# Patient Record
Sex: Female | Born: 1935 | Race: White | Hispanic: No | Smoking: Former smoker
Health system: Southern US, Community
[De-identification: ages and names within clinical notes are randomized; demographics above are authoritative.]

## PROBLEM LIST (undated history)

## (undated) DIAGNOSIS — C4491 Basal cell carcinoma of skin, unspecified: Secondary | ICD-10-CM

## (undated) DIAGNOSIS — I351 Nonrheumatic aortic (valve) insufficiency: Secondary | ICD-10-CM

## (undated) DIAGNOSIS — J189 Pneumonia, unspecified organism: Secondary | ICD-10-CM

## (undated) DIAGNOSIS — J45909 Unspecified asthma, uncomplicated: Secondary | ICD-10-CM

## (undated) DIAGNOSIS — I10 Essential (primary) hypertension: Secondary | ICD-10-CM

## (undated) DIAGNOSIS — Z87898 Personal history of other specified conditions: Secondary | ICD-10-CM

## (undated) DIAGNOSIS — I340 Nonrheumatic mitral (valve) insufficiency: Secondary | ICD-10-CM

## (undated) DIAGNOSIS — R0781 Pleurodynia: Secondary | ICD-10-CM

## (undated) HISTORY — DX: Personal history of other specified conditions: Z87.898

## (undated) HISTORY — DX: Pleurodynia: R07.81

---
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
## 2014-02-13 ENCOUNTER — Encounter (INDEPENDENT_AMBULATORY_CARE_PROVIDER_SITE_OTHER): Payer: Self-pay

## 2014-02-13 ENCOUNTER — Ambulatory Visit (INDEPENDENT_AMBULATORY_CARE_PROVIDER_SITE_OTHER): Payer: Medicare Other | Admitting: Cardiovascular Disease

## 2014-02-13 ENCOUNTER — Encounter: Payer: Self-pay | Admitting: *Deleted

## 2014-02-13 ENCOUNTER — Encounter: Payer: Self-pay | Admitting: Cardiovascular Disease

## 2014-02-13 VITALS — BP 123/68 | HR 78 | Ht 65.0 in | Wt 179.0 lb

## 2014-02-13 DIAGNOSIS — R011 Cardiac murmur, unspecified: Secondary | ICD-10-CM | POA: Insufficient documentation

## 2014-02-13 DIAGNOSIS — R0781 Pleurodynia: Secondary | ICD-10-CM | POA: Insufficient documentation

## 2014-02-13 DIAGNOSIS — Z87898 Personal history of other specified conditions: Secondary | ICD-10-CM | POA: Insufficient documentation

## 2014-02-13 DIAGNOSIS — R9431 Abnormal electrocardiogram [ECG] [EKG]: Secondary | ICD-10-CM

## 2014-02-13 NOTE — Assessment & Plan Note (Signed)
For age LA signal not that remarkable  With murmur will check echo for chamber sizes and LV mass  No chest pain or need for  Ischemic w/u

## 2014-02-13 NOTE — Progress Notes (Signed)
Patient ID: Krista Parker, female   DOB: 01-Jul-1932, 80 y.o.   MRN: [id number]   80 yo referred by Dr Ramon Dredge for abnormal ECG and murmur.  She has generally been healthy.  She is widowed 2 years ago and originally from [place].  She has multiple children and grandchildren in [place].  She had rib pain earlier this Spring and has some fatigue.  She had some allergies.  No fever no chest pain Mild exertional dyspnea History of scarlet fever.  History of murmur but "not serious"  ECG obtained by primary 5/4 reviewed and flat lateral ST segments borderline LAE.  No criteria for LVH.  She walks regularly.  No palpitations, syncope or history of PAF.  BP occasionally high but fluctuates.  On no cardiac meds.  Rib pain has improved and actually sounds more like crampy abdominal pain Or IBS.     ROS: Denies fever, malais, weight loss, blurry vision, decreased visual acuity, cough, sputum, SOB, hemoptysis, pleuritic pain, palpitaitons, heartburn, abdominal pain, melena, lower extremity edema, claudication, or rash.  All other systems reviewed and negative   General: Affect appropriate Healthy:  appears stated age 80: normal Neck supple with no adenopathy JVP normal no bruits no thyromegaly Lungs clear with no wheezing and good diaphragmatic motion Heart:  S1/S2 1/6 SEM  murmur,rub, gallop or click PMI normal Abdomen: benighn, BS positve, no tenderness, no AAA no bruit.  No HSM or HJR Distal pulses intact with no bruits No edema Neuro non-focal Skin warm and dry No muscular weakness  Medications Current Outpatient Prescriptions  Medication Sig Dispense Refill  . CALCIUM-VITAMIN D PO Chew 2 daily      . DiphenhydrAMINE HCl (BENADRYL PO) Take by mouth as needed.      Krista Parker Sodium (COLACE PO) Take by mouth as needed.      . Naproxen Sodium (ALEVE PO) Take by mouth as needed.       No current facility-administered medications for this visit.    Allergies Review of patient's  allergies indicates no known allergies.  Family History: No family history on file.  Social History: History   Social History  . Marital Status: Widowed    Spouse Name: N/A    Number of Children: N/A  . Years of Education: N/A   Occupational History  . Not on file.   Social History Main Topics  . Smoking status: Former Research scientist (life sciences)  . Smokeless tobacco: Not on file  . Alcohol Use: Yes  . Drug Use: No  . Sexual Activity: Not on file   Other Topics Concern  . Not on file   Social History Narrative  . No narrative on file    Electrocardiogram:  5/4  SR LAE flat lateral ST segments   Assessment and Plan

## 2014-02-13 NOTE — Patient Instructions (Signed)

## 2014-02-13 NOTE — Assessment & Plan Note (Signed)
AV sclerosis murmur f/u echo in light of abnormal eCG

## 2014-03-05 ENCOUNTER — Ambulatory Visit (HOSPITAL_COMMUNITY): Payer: Medicare Other | Attending: Cardiology | Admitting: Radiology

## 2014-03-05 ENCOUNTER — Other Ambulatory Visit: Payer: Self-pay

## 2014-03-05 DIAGNOSIS — Z87891 Personal history of nicotine dependence: Secondary | ICD-10-CM | POA: Insufficient documentation

## 2014-03-05 DIAGNOSIS — R9431 Abnormal electrocardiogram [ECG] [EKG]: Secondary | ICD-10-CM | POA: Insufficient documentation

## 2014-03-05 DIAGNOSIS — I359 Nonrheumatic aortic valve disorder, unspecified: Secondary | ICD-10-CM | POA: Insufficient documentation

## 2014-03-05 DIAGNOSIS — R011 Cardiac murmur, unspecified: Secondary | ICD-10-CM | POA: Insufficient documentation

## 2014-03-05 NOTE — Progress Notes (Signed)
Echocardiogram performed.  

## 2014-03-08 ENCOUNTER — Telehealth: Payer: Self-pay | Admitting: Cardiovascular Disease

## 2014-03-08 NOTE — Telephone Encounter (Signed)
New message ° ° ° ° ° °Want echo results °

## 2014-03-08 NOTE — Telephone Encounter (Signed)
PT AWARE OF ECHO RESULTS./CY 

## 2015-05-27 ENCOUNTER — Other Ambulatory Visit: Payer: Self-pay | Admitting: Gastroenterology

## 2015-05-27 DIAGNOSIS — R1011 Right upper quadrant pain: Secondary | ICD-10-CM

## 2015-06-02 ENCOUNTER — Ambulatory Visit
Admission: RE | Admit: 2015-06-02 | Discharge: 2015-06-02 | Disposition: A | Discharge status: Home / Self Care | Payer: Medicare Other | Source: Ambulatory Visit | Attending: Gastroenterology | Admitting: Gastroenterology

## 2015-06-02 DIAGNOSIS — R1011 Right upper quadrant pain: Secondary | ICD-10-CM

## 2015-06-18 ENCOUNTER — Emergency Department (HOSPITAL_COMMUNITY)
Admission: EM | Admit: 2015-06-18 | Discharge: 2015-06-18 | Disposition: A | Discharge status: Home / Self Care | Payer: Medicare Other | Attending: Emergency Medicine | Admitting: Emergency Medicine

## 2015-06-18 ENCOUNTER — Emergency Department (HOSPITAL_COMMUNITY): Payer: Medicare Other

## 2015-06-18 ENCOUNTER — Encounter (HOSPITAL_COMMUNITY): Payer: Self-pay | Admitting: *Deleted

## 2015-06-18 DIAGNOSIS — S22078A Other fracture of T9-T10 vertebra, initial encounter for closed fracture: Secondary | ICD-10-CM | POA: Diagnosis not present

## 2015-06-18 DIAGNOSIS — Y999 Unspecified external cause status: Secondary | ICD-10-CM | POA: Diagnosis not present

## 2015-06-18 DIAGNOSIS — X58XXXA Exposure to other specified factors, initial encounter: Secondary | ICD-10-CM | POA: Diagnosis not present

## 2015-06-18 DIAGNOSIS — S22068A Other fracture of T7-T8 thoracic vertebra, initial encounter for closed fracture: Secondary | ICD-10-CM | POA: Insufficient documentation

## 2015-06-18 DIAGNOSIS — Z79899 Other long term (current) drug therapy: Secondary | ICD-10-CM | POA: Diagnosis not present

## 2015-06-18 DIAGNOSIS — Y929 Unspecified place or not applicable: Secondary | ICD-10-CM | POA: Diagnosis not present

## 2015-06-18 DIAGNOSIS — Y939 Activity, unspecified: Secondary | ICD-10-CM | POA: Diagnosis not present

## 2015-06-18 DIAGNOSIS — S22009A Unspecified fracture of unspecified thoracic vertebra, initial encounter for closed fracture: Secondary | ICD-10-CM

## 2015-06-18 DIAGNOSIS — S32048A Other fracture of fourth lumbar vertebra, initial encounter for closed fracture: Secondary | ICD-10-CM | POA: Insufficient documentation

## 2015-06-18 DIAGNOSIS — Z87891 Personal history of nicotine dependence: Secondary | ICD-10-CM | POA: Insufficient documentation

## 2015-06-18 DIAGNOSIS — S299XXA Unspecified injury of thorax, initial encounter: Secondary | ICD-10-CM | POA: Diagnosis present

## 2015-06-18 LAB — BASIC METABOLIC PANEL
ANION GAP: 11 (ref 5–15)
BUN: 18 mg/dL (ref 6–20)
CALCIUM: 9.5 mg/dL (ref 8.9–10.3)
CO2: 22 mmol/L (ref 22–32)
CREATININE: 0.83 mg/dL (ref 0.44–1.00)
Chloride: 101 mmol/L (ref 101–111)
Glucose, Bld: 102 mg/dL — ABNORMAL HIGH (ref 65–99)
Potassium: 4.1 mmol/L (ref 3.5–5.1)
Sodium: 134 mmol/L — ABNORMAL LOW (ref 135–145)

## 2015-06-18 LAB — CBC
HCT: 38.4 % (ref 36.0–46.0)
Hemoglobin: 13.2 g/dL (ref 12.0–15.0)
MCH: 30.3 pg (ref 26.0–34.0)
MCHC: 34.4 g/dL (ref 30.0–36.0)
MCV: 88.3 fL (ref 78.0–100.0)
PLATELETS: 255 10*3/uL (ref 150–400)
RBC: 4.35 MIL/uL (ref 3.87–5.11)
RDW: 12.6 % (ref 11.5–15.5)
WBC: 7.5 10*3/uL (ref 4.0–10.5)

## 2015-06-18 LAB — I-STAT TROPONIN, ED: TROPONIN I, POC: 0.01 ng/mL (ref 0.00–0.08)

## 2015-06-18 MED ORDER — HYDROCODONE-ACETAMINOPHEN 5-325 MG PO TABS: 1.0000 | ORAL_TABLET | Freq: Four times a day (QID) | ORAL | Status: DC | PRN

## 2015-06-18 MED ORDER — HYDROCODONE-ACETAMINOPHEN 5-325 MG PO TABS
1.0000 | ORAL_TABLET | Freq: Once | ORAL | Status: AC
Start: 2015-06-18 — End: 2015-06-18
  Administered 2015-06-18: 1 via ORAL
  Filled 2015-06-18: qty 1

## 2015-06-18 NOTE — ED Notes (Signed)
Pt reports having back pain with movement in back and hurts to take a deep breath.

## 2015-06-18 NOTE — ED Notes (Signed)
Pt transported to Xray. 

## 2015-06-18 NOTE — ED Provider Notes (Signed)
CSN: [id number]     Arrival date & time 06/18/15  1221 History   First MD Initiated Contact with Patient 06/18/15 1354     Chief Complaint  Patient presents with  . Back Pain     (Consider location/radiation/quality/duration/timing/severity/associated sxs/prior Treatment) HPI Comments: 81 year old healthy female who presents with back pain. Patient states that 2 weeks ago she began having pain in her right arm. This pain later improved but then she began having generalized body pain and difficulty getting out of bed. For the past few days she has had severe thoracic back pain which is worse with any movement of her arms and worse when she takes a deep breath. The pain feels like it is in her back along her bra line and wraps around to the front at the bottom of her ribs. She denies any anterior chest pain, fevers, cough/cold symptoms, or shortness of breath. No vomiting, diarrhea, or abdominal pain. No trauma or recent falls. She has been taking Tylenol and Aleve but the pain remains severe.  Patient is a 81 y.o. female presenting with back pain. The history is provided by the patient.  Back Pain   Past Medical History  Diagnosis Date  . Rib pain   . History of abdominal pain    No past surgical history on file. No family history on file. Social History  Substance Use Topics  . Smoking status: Former Research scientist (life sciences)  . Smokeless tobacco: None  . Alcohol Use: Yes   OB History    No data available     Review of Systems  Musculoskeletal: Positive for back pain.   10 Systems reviewed and are negative for acute change except as noted in the HPI.    Allergies  Review of patient's allergies indicates no known allergies.  Home Medications   Prior to Admission medications   Medication Sig Start Date End Date Taking? Authorizing Provider  acetaminophen (TYLENOL) 650 MG CR tablet Take 650 mg by mouth every 8 (eight) hours as needed for pain.   Yes Historical Provider, MD  Calcium-Vitamin  D 500-125 MG-UNIT TABS Take 3 each by mouth daily.   Yes Historical Provider, MD  CALCIUM-VITAMIN D PO Take 3 each by mouth daily. Chew 2 daily   Yes Historical Provider, MD  diphenhydrAMINE (BENADRYL) 25 mg capsule Take 25 mg by mouth at bedtime as needed.   Yes Historical Provider, MD  Naproxen Sodium 220 MG CAPS Take 2 tablets by mouth daily as needed. For pain   Yes Historical Provider, MD  omeprazole (PRILOSEC) 20 MG capsule Take 20 mg by mouth daily. 06/07/15  Yes Historical Provider, MD  polyethylene glycol powder (GLYCOLAX/MIRALAX) powder Take 17 g by mouth daily as needed.   Yes Historical Provider, MD  DiphenhydrAMINE HCl (BENADRYL PO) Take by mouth as needed.    Historical Provider, MD  Docusate Sodium (COLACE PO) Take by mouth as needed.    Historical Provider, MD  Naproxen Sodium (ALEVE PO) Take by mouth as needed.    Historical Provider, MD   BP 169/69 mmHg  Pulse 74  Temp(Src) 98.1 F (36.7 C) (Oral)  Resp 20  Ht 5\' 5"  (1.651 m)  Wt 173 lb (78.472 kg)  BMI 28.79 kg/m2  SpO2 87% Physical Exam  Constitutional: She is oriented to person, place, and time. She appears well-developed and well-nourished. No distress.  HENT:  Head: Normocephalic and atraumatic.  Moist mucous membranes  Eyes: Conjunctivae are normal. Pupils are equal, round, and reactive to light.  Neck: Neck supple.  Cardiovascular: Normal rate, regular rhythm and normal heart sounds.   No murmur heard. Pulmonary/Chest: Effort normal and breath sounds normal.  Abdominal: Soft. Bowel sounds are normal. She exhibits no distension. There is no tenderness.  Musculoskeletal: She exhibits no edema.  No midline spinal tenderness or stepoff  Neurological: She is alert and oriented to person, place, and time. She exhibits normal muscle tone.  Fluent speech, 5/5 strength and normal sensation x all 4 extremities  Skin: Skin is warm and dry. No rash noted.  Psychiatric: She has a normal mood and affect. Judgment normal.   Nursing note and vitals reviewed.   ED Course  Procedures (including critical care time) Labs Review Labs Reviewed  BASIC METABOLIC PANEL - Abnormal; Notable for the following:    Sodium 134 (*)    Glucose, Bld 102 (*)    All other components within normal limits  CBC  I-STAT TROPOININ, ED    Imaging Review Dg Chest 2 View  06/18/2015   CLINICAL DATA:  Lower rib pain for 2 weeks.  No known injury.  EXAM: CHEST  2 VIEW  COMPARISON:  None.  FINDINGS: The cardiac silhouette, mediastinal and hilar contours are within normal limits. Chronic appearing bronchitic type lung changes and streaky basilar atelectasis. No definite infiltrates or effusions. The bony thorax is intact. There are multiple thoracic compression fractures.  IMPRESSION: No acute cardiopulmonary findings. Chronic appearing bronchitic lung changes and basilar scarring.  Thoracic compression fractures are noted of uncertain age.   Electronically Signed   By: Marijo Sanes M.D.   On: 06/18/2015 14:36   Ct Thoracic Spine Wo Contrast  06/18/2015   CLINICAL DATA:  Severe back pain. Evaluate compression fractures seen on x-rays.  EXAM: CT LUMBAR SPINE WITHOUT CONTRAST  TECHNIQUE: Multidetector CT imaging of the lumbar spine was performed without intravenous contrast administration. Multiplanar CT image reconstructions were also generated.  COMPARISON:  Chest x-ray 06/18/2015  FINDINGS: There are compression fractures of T7, T9 and L4. The T7 fracture appears to be acute with paraspinal hematoma. No retropulsion or canal compromise. The T9 fracture is likely chronic. No retropulsion or canal compromise. The the L4 fracture is acute. Minimal retropulsion of the superior posterior aspect of the L4 vertebral body but no significant canal compromise.  Advanced facet disease in the lower lumbar spine but no pars defects. No facet or laminar fractures.  Moderate multifactorial spinal and bilateral lateral recess stenosis at L4-5 due to a bulging  uncovered disc, short pedicles and facet disease.  The lungs demonstrate emphysematous changes but no acute pulmonary findings. No worrisome pulmonary lesions. Two small right lower lobe pulmonary nodules are noted measuring 4.5 and 5 mm respectively these are best seen on series 2010 image 75 and 78.  No definite rib fractures. The visualized bony pelvis is intact. The SI joints are intact. Moderate vascular calcifications.  IMPRESSION: 1. Acute T7 and L4 compression fractures as discussed above. The T9 fracture appears remote. 2. Advanced facet disease in the lower lumbar spine. 3. 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 4. Two right lower lobe pulmonary nodules. If the patient is at high risk for bronchogenic carcinoma, follow-up chest CT at 6-12 months is recommended. If the patient is at low risk for bronchogenic carcinoma, follow-up chest CT at 12 months is recommended. This recommendation follows the consensus statement: Guidelines for Management of Small Pulmonary Nodules Detected on CT Scans: A Statement from the [employer] as published  in Radiology 2005;237:395-400. 5. Vascular calcifications without definite aneurysm.   Electronically Signed   By: Marijo Sanes M.D.   On: 06/18/2015 16:14   Ct Lumbar Spine Wo Contrast  06/18/2015   CLINICAL DATA:  Severe back pain. Evaluate compression fractures seen on x-rays.  EXAM: CT LUMBAR SPINE WITHOUT CONTRAST  TECHNIQUE: Multidetector CT imaging of the lumbar spine was performed without intravenous contrast administration. Multiplanar CT image reconstructions were also generated.  COMPARISON:  Chest x-ray 06/18/2015  FINDINGS: There are compression fractures of T7, T9 and L4. The T7 fracture appears to be acute with paraspinal hematoma. No retropulsion or canal compromise. The T9 fracture is likely chronic. No retropulsion or canal compromise. The the L4 fracture is acute. Minimal retropulsion of the superior  posterior aspect of the L4 vertebral body but no significant canal compromise.  Advanced facet disease in the lower lumbar spine but no pars defects. No facet or laminar fractures.  Moderate multifactorial spinal and bilateral lateral recess stenosis at L4-5 due to a bulging uncovered disc, short pedicles and facet disease.  The lungs demonstrate emphysematous changes but no acute pulmonary findings. No worrisome pulmonary lesions. Two small right lower lobe pulmonary nodules are noted measuring 4.5 and 5 mm respectively these are best seen on series 2010 image 75 and 78.  No definite rib fractures. The visualized bony pelvis is intact. The SI joints are intact. Moderate vascular calcifications.  IMPRESSION: 1. Acute T7 and L4 compression fractures as discussed above. The T9 fracture appears remote. 2. Advanced facet disease in the lower lumbar spine. 3. 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 4. Two right lower lobe pulmonary nodules. If the patient is at high risk for bronchogenic carcinoma, follow-up chest CT at 6-12 months is recommended. If the patient is at low risk for bronchogenic carcinoma, follow-up chest CT at 12 months is recommended. This recommendation follows the consensus statement: Guidelines for Management of Small Pulmonary Nodules Detected on CT Scans: A Statement from the [employer] as published in Radiology 2005;237:395-400. 5. Vascular calcifications without definite aneurysm.   Electronically Signed   By: Marijo Sanes M.D.   On: 06/18/2015 16:14   I have personally reviewed and evaluated these images and lab results as part of my medical decision-making.   EKG Interpretation   Date/Time:  Wednesday June 18 2015 12:35:49 EDT Ventricular Rate:  88 PR Interval:  154 QRS Duration: 66 QT Interval:  362 QTC Calculation: 438 R Axis:   58 Text Interpretation:  Normal sinus rhythm Possible Left atrial enlargement  Low voltage QRS T  wave abnormality, consider anterolateral ischemia  Abnormal ECG ST elevation in aVR with depression in II,  T wave inversion  in III,  concerning for posterior ischemia Confirmed by LITTLE MD, RACHEL  ([id number]) on 06/18/2015 12:51:38 PM     Medications  HYDROcodone-acetaminophen (NORCO/VICODIN) 5-325 MG per tablet 1 tablet (1 tablet Oral Given 06/18/15 1512)    MDM   Final diagnoses:  None   81 year old female who presents with several days of worsening back pain radiating to her sides below her ribs which is worse w/ arm movement. Patient uncomfortable but well appearing at presentation. Vital signs unremarkable. EKG showed questionable ST depression in lead II, no previous EKG for comparison. Patient denies anterior chest pain and denying any sudden ripping or tearing pain. No neurologic deficits on exam. Obtained above labs as well as chest x-ray.  Chest x-ray showed several thoracic spine compression fractures. Obtain  CT of T and L-spine for better characterization.CT shows chronic T9 fracture and acute fractures at T7 and L4. No canal compromise at any of the fracture sites. I spoke with neurosurgery regarding the patient's fractures and T7 paraspinal hematoma; I appreciate Dr. Donnella Bi assistance. He has recommended bedrest and no bending or lifting. I have relayed this information to the patient. He will see the patient in 1-2 weeks in the clinic.  Patient denies any anesthesia, extremity weakness, or bowel/bladder incontinence and I feel that she is safe for discharge home with pain control. I have extensively reviewed return precautions and they have voiced understanding. Patient remains well appearing at time of discharge and states that her pain is improved after receiving Norco. I have provided her with a prescription for pain control at home and discussed precautions regarding narcotic use. All questions answered. Patient discharged in satisfactory condition.  Sharlett Iles,  MD 06/18/15 [phone]

## 2015-07-04 ENCOUNTER — Other Ambulatory Visit: Payer: Self-pay | Admitting: Family Medicine

## 2015-07-04 DIAGNOSIS — M810 Age-related osteoporosis without current pathological fracture: Secondary | ICD-10-CM

## 2015-07-26 ENCOUNTER — Other Ambulatory Visit: Payer: Self-pay | Admitting: Family Medicine

## 2015-07-26 DIAGNOSIS — E2839 Other primary ovarian failure: Secondary | ICD-10-CM

## 2015-07-29 ENCOUNTER — Other Ambulatory Visit: Payer: Medicare Other

## 2016-06-10 ENCOUNTER — Other Ambulatory Visit: Payer: Medicare Other

## 2016-06-10 ENCOUNTER — Other Ambulatory Visit: Payer: Self-pay | Admitting: Nurse Practitioner

## 2016-06-10 DIAGNOSIS — R918 Other nonspecific abnormal finding of lung field: Secondary | ICD-10-CM

## 2016-06-11 ENCOUNTER — Ambulatory Visit
Admission: RE | Admit: 2016-06-11 | Discharge: 2016-06-11 | Disposition: A | Discharge status: Home / Self Care | Payer: Medicare Other | Source: Ambulatory Visit | Attending: Nurse Practitioner | Admitting: Nurse Practitioner

## 2016-06-11 DIAGNOSIS — R918 Other nonspecific abnormal finding of lung field: Secondary | ICD-10-CM

## 2016-07-15 DIAGNOSIS — C4491 Basal cell carcinoma of skin, unspecified: Secondary | ICD-10-CM

## 2016-07-15 HISTORY — DX: Basal cell carcinoma of skin, unspecified: C44.91

## 2019-03-26 ENCOUNTER — Encounter: Payer: Self-pay | Admitting: *Deleted

## 2019-07-31 ENCOUNTER — Other Ambulatory Visit: Payer: Self-pay | Admitting: Gastroenterology

## 2019-07-31 ENCOUNTER — Telehealth: Payer: Self-pay | Admitting: *Deleted

## 2019-07-31 DIAGNOSIS — R1310 Dysphagia, unspecified: Secondary | ICD-10-CM

## 2019-07-31 DIAGNOSIS — R1319 Other dysphagia: Secondary | ICD-10-CM

## 2019-08-07 ENCOUNTER — Ambulatory Visit
Admission: RE | Admit: 2019-08-07 | Discharge: 2019-08-07 | Disposition: A | Discharge status: Home / Self Care | Payer: Medicare Other | Source: Ambulatory Visit | Attending: Gastroenterology | Admitting: Gastroenterology

## 2019-08-07 ENCOUNTER — Other Ambulatory Visit: Payer: Self-pay

## 2019-08-07 DIAGNOSIS — R1310 Dysphagia, unspecified: Secondary | ICD-10-CM | POA: Diagnosis present

## 2019-08-07 DIAGNOSIS — R1319 Other dysphagia: Secondary | ICD-10-CM

## 2019-12-11 ENCOUNTER — Emergency Department: Payer: Medicare Other

## 2019-12-11 ENCOUNTER — Inpatient Hospital Stay
Admission: EM | Admit: 2019-12-11 | Discharge: 2019-12-18 | DRG: 193 | Disposition: A | Discharge status: Home Health | Payer: Medicare Other | Attending: Internal Medicine | Admitting: Internal Medicine

## 2019-12-11 ENCOUNTER — Other Ambulatory Visit: Payer: Self-pay

## 2019-12-11 ENCOUNTER — Encounter: Payer: Self-pay | Admitting: *Deleted

## 2019-12-11 DIAGNOSIS — I10 Essential (primary) hypertension: Secondary | ICD-10-CM | POA: Insufficient documentation

## 2019-12-11 DIAGNOSIS — E871 Hypo-osmolality and hyponatremia: Secondary | ICD-10-CM | POA: Diagnosis present

## 2019-12-11 DIAGNOSIS — J440 Chronic obstructive pulmonary disease with acute lower respiratory infection: Secondary | ICD-10-CM | POA: Diagnosis present

## 2019-12-11 DIAGNOSIS — J441 Chronic obstructive pulmonary disease with (acute) exacerbation: Secondary | ICD-10-CM | POA: Diagnosis present

## 2019-12-11 DIAGNOSIS — J9601 Acute respiratory failure with hypoxia: Secondary | ICD-10-CM | POA: Diagnosis present

## 2019-12-11 DIAGNOSIS — I160 Hypertensive urgency: Secondary | ICD-10-CM | POA: Diagnosis present

## 2019-12-11 DIAGNOSIS — Z85828 Personal history of other malignant neoplasm of skin: Secondary | ICD-10-CM | POA: Diagnosis not present

## 2019-12-11 DIAGNOSIS — Z87891 Personal history of nicotine dependence: Secondary | ICD-10-CM

## 2019-12-11 DIAGNOSIS — Z66 Do not resuscitate: Secondary | ICD-10-CM | POA: Diagnosis present

## 2019-12-11 DIAGNOSIS — E669 Obesity, unspecified: Secondary | ICD-10-CM | POA: Diagnosis present

## 2019-12-11 DIAGNOSIS — I351 Nonrheumatic aortic (valve) insufficiency: Secondary | ICD-10-CM | POA: Diagnosis present

## 2019-12-11 DIAGNOSIS — I080 Rheumatic disorders of both mitral and aortic valves: Secondary | ICD-10-CM | POA: Diagnosis present

## 2019-12-11 DIAGNOSIS — J189 Pneumonia, unspecified organism: Principal | ICD-10-CM

## 2019-12-11 DIAGNOSIS — N898 Other specified noninflammatory disorders of vagina: Secondary | ICD-10-CM | POA: Diagnosis present

## 2019-12-11 DIAGNOSIS — Z20822 Contact with and (suspected) exposure to covid-19: Secondary | ICD-10-CM | POA: Diagnosis present

## 2019-12-11 DIAGNOSIS — Z6832 Body mass index (BMI) 32.0-32.9, adult: Secondary | ICD-10-CM | POA: Diagnosis not present

## 2019-12-11 DIAGNOSIS — I340 Nonrheumatic mitral (valve) insufficiency: Secondary | ICD-10-CM | POA: Diagnosis present

## 2019-12-11 DIAGNOSIS — R0902 Hypoxemia: Secondary | ICD-10-CM | POA: Insufficient documentation

## 2019-12-11 DIAGNOSIS — R52 Pain, unspecified: Secondary | ICD-10-CM

## 2019-12-11 HISTORY — DX: Nonrheumatic mitral (valve) insufficiency: I34.0

## 2019-12-11 HISTORY — DX: Unspecified asthma, uncomplicated: J45.909

## 2019-12-11 HISTORY — DX: Essential (primary) hypertension: I10

## 2019-12-11 HISTORY — DX: Nonrheumatic aortic (valve) insufficiency: I35.1

## 2019-12-11 HISTORY — DX: Pneumonia, unspecified organism: J18.9

## 2019-12-11 LAB — CBC WITH DIFFERENTIAL/PLATELET
Abs Immature Granulocytes: 0.19 10*3/uL — ABNORMAL HIGH (ref 0.00–0.07)
Basophils Absolute: 0.1 10*3/uL (ref 0.0–0.1)
Basophils Relative: 0 %
Eosinophils Absolute: 0.1 10*3/uL (ref 0.0–0.5)
Eosinophils Relative: 1 %
HCT: 41.3 % (ref 36.0–46.0)
Hemoglobin: 14.2 g/dL (ref 12.0–15.0)
Immature Granulocytes: 1 %
Lymphocytes Relative: 6 %
Lymphs Abs: 1 10*3/uL (ref 0.7–4.0)
MCH: 29 pg (ref 26.0–34.0)
MCHC: 34.4 g/dL (ref 30.0–36.0)
MCV: 84.5 fL (ref 80.0–100.0)
Monocytes Absolute: 0.9 10*3/uL (ref 0.1–1.0)
Monocytes Relative: 5 %
Neutro Abs: 14 10*3/uL — ABNORMAL HIGH (ref 1.7–7.7)
Neutrophils Relative %: 87 %
Platelets: 499 10*3/uL — ABNORMAL HIGH (ref 150–400)
RBC: 4.89 MIL/uL (ref 3.87–5.11)
RDW: 12.5 % (ref 11.5–15.5)
WBC: 16.2 10*3/uL — ABNORMAL HIGH (ref 4.0–10.5)
nRBC: 0 % (ref 0.0–0.2)

## 2019-12-11 LAB — COMPREHENSIVE METABOLIC PANEL
ALT: 23 U/L (ref 0–44)
AST: 28 U/L (ref 15–41)
Albumin: 3.6 g/dL (ref 3.5–5.0)
Alkaline Phosphatase: 86 U/L (ref 38–126)
Anion gap: 12 (ref 5–15)
BUN: 19 mg/dL (ref 8–23)
CO2: 25 mmol/L (ref 22–32)
Calcium: 9.7 mg/dL (ref 8.9–10.3)
Chloride: 91 mmol/L — ABNORMAL LOW (ref 98–111)
Creatinine, Ser: 0.74 mg/dL (ref 0.44–1.00)
GFR calc Af Amer: >60 mL/min (ref >60)
GFR calc non Af Amer: >60 mL/min (ref >60)
Glucose, Bld: 110 mg/dL — ABNORMAL HIGH (ref 70–99)
Potassium: 3.7 mmol/L (ref 3.5–5.1)
Sodium: 128 mmol/L — ABNORMAL LOW (ref 135–145)
Total Bilirubin: 0.7 mg/dL (ref 0.3–1.2)
Total Protein: 7.4 g/dL (ref 6.5–8.1)

## 2019-12-11 LAB — TROPONIN I (HIGH SENSITIVITY): Troponin I (High Sensitivity): 6 ng/L (ref <18)

## 2019-12-11 LAB — BRAIN NATRIURETIC PEPTIDE: B Natriuretic Peptide: 278 pg/mL — ABNORMAL HIGH (ref 0.0–100.0)

## 2019-12-11 LAB — LACTIC ACID, PLASMA
Lactic Acid, Venous: 1.7 mmol/L (ref 0.5–1.9)
Lactic Acid, Venous: 1.1 mmol/L (ref 0.5–1.9)

## 2019-12-11 LAB — PROCALCITONIN: Procalcitonin: <0.1 ng/mL

## 2019-12-11 MED ORDER — SODIUM CHLORIDE 0.9 % IV SOLN
2.0000 g | Freq: Once | INTRAVENOUS | Status: AC
  Administered 2019-12-11: 2 g via INTRAVENOUS
  Filled 2019-12-11: qty 20

## 2019-12-11 MED ORDER — SODIUM CHLORIDE 0.9 % IV SOLN: INTRAVENOUS | Status: DC

## 2019-12-11 MED ORDER — IOHEXOL 350 MG/ML SOLN
75.0000 mL | Freq: Once | INTRAVENOUS | Status: AC | PRN
  Administered 2019-12-11: 75 mL via INTRAVENOUS

## 2019-12-11 MED ORDER — SODIUM CHLORIDE 0.9 % IV SOLN
500.0000 mg | INTRAVENOUS | Status: AC
  Administered 2019-12-12 – 2019-12-15 (×4): 500 mg via INTRAVENOUS
  Filled 2019-12-11 (×4): qty 500

## 2019-12-11 MED ORDER — VANCOMYCIN HCL IN DEXTROSE 1-5 GM/200ML-% IV SOLN: 1000.0000 mg | Freq: Once | INTRAVENOUS | Status: DC

## 2019-12-11 MED ORDER — SODIUM CHLORIDE 0.9 % IV SOLN
2.0000 g | INTRAVENOUS | Status: AC
  Administered 2019-12-12 – 2019-12-16 (×4): 2 g via INTRAVENOUS
  Filled 2019-12-11 (×2): qty 2
  Filled 2019-12-11: qty 20
  Filled 2019-12-11 (×2): qty 2

## 2019-12-11 MED ORDER — METHYLPREDNISOLONE SODIUM SUCC 40 MG IJ SOLR
40.0000 mg | Freq: Four times a day (QID) | INTRAMUSCULAR | Status: AC
  Administered 2019-12-12 (×4): 40 mg via INTRAVENOUS
  Filled 2019-12-11 (×4): qty 1

## 2019-12-11 MED ORDER — IPRATROPIUM-ALBUTEROL 0.5-2.5 (3) MG/3ML IN SOLN
3.0000 mL | Freq: Once | RESPIRATORY_TRACT | Status: AC
  Administered 2019-12-11: 3 mL via RESPIRATORY_TRACT
  Filled 2019-12-11: qty 3

## 2019-12-11 MED ORDER — LABETALOL HCL 5 MG/ML IV SOLN: 10.0000 mg | INTRAVENOUS | Status: DC | PRN

## 2019-12-11 MED ORDER — SODIUM CHLORIDE 0.9 % IV BOLUS
1000.0000 mL | Freq: Once | INTRAVENOUS | Status: AC
  Administered 2019-12-11: 1000 mL via INTRAVENOUS

## 2019-12-11 MED ORDER — IPRATROPIUM-ALBUTEROL 0.5-2.5 (3) MG/3ML IN SOLN
3.0000 mL | Freq: Four times a day (QID) | RESPIRATORY_TRACT | Status: DC
  Administered 2019-12-12 – 2019-12-13 (×6): 3 mL via RESPIRATORY_TRACT
  Filled 2019-12-11 (×6): qty 3

## 2019-12-11 MED ORDER — IPRATROPIUM-ALBUTEROL 0.5-2.5 (3) MG/3ML IN SOLN
3.0000 mL | Freq: Once | RESPIRATORY_TRACT | Status: AC
  Administered 2019-12-11: 21:00:00 3 mL via RESPIRATORY_TRACT
  Filled 2019-12-11: qty 3

## 2019-12-11 MED ORDER — ALBUTEROL SULFATE (2.5 MG/3ML) 0.083% IN NEBU: 2.5000 mg | INHALATION_SOLUTION | RESPIRATORY_TRACT | Status: DC | PRN

## 2019-12-11 MED ORDER — ENOXAPARIN SODIUM 40 MG/0.4ML ~~LOC~~ SOLN
40.0000 mg | SUBCUTANEOUS | Status: DC
  Administered 2019-12-12 – 2019-12-17 (×7): 40 mg via SUBCUTANEOUS
  Filled 2019-12-11 (×7): qty 0.4

## 2019-12-11 MED ORDER — METHYLPREDNISOLONE SODIUM SUCC 125 MG IJ SOLR
125.0000 mg | Freq: Once | INTRAMUSCULAR | Status: AC
  Administered 2019-12-11: 125 mg via INTRAVENOUS
  Filled 2019-12-11: qty 2

## 2019-12-11 MED ORDER — SODIUM CHLORIDE 0.9 % IV SOLN
500.0000 mg | Freq: Once | INTRAVENOUS | Status: AC
  Administered 2019-12-12: 500 mg via INTRAVENOUS
  Filled 2019-12-11: qty 500

## 2019-12-11 MED ORDER — VANCOMYCIN HCL 1500 MG/300ML IV SOLN
1500.0000 mg | Freq: Once | INTRAVENOUS | Status: DC
  Filled 2019-12-11: qty 300

## 2019-12-11 MED ORDER — PREDNISONE 20 MG PO TABS
40.0000 mg | ORAL_TABLET | Freq: Every day | ORAL | Status: AC
  Administered 2019-12-13 – 2019-12-16 (×4): 40 mg via ORAL
  Filled 2019-12-11 (×4): qty 2

## 2019-12-11 NOTE — ED Triage Notes (Addendum)
Per EMS report, patient c/o increased shortness of breath and weakness today after being diagnosed with pneumonia one week ago. Patient report increased dyspnea with movement. Patient was 91% on Room air at home and placed on 3L O2 via [place] by EMT and was 98% per their report. Patient arrives alert and oriented x4.  Patient lives in independent living at [facility]. Patient had two doses of Moderna with the second dose on 11/21/19. Patient tested negative for Covid and the flu on 12/07/2019.

## 2019-12-11 NOTE — ED Notes (Signed)
Patient voided over the bed pan. Purewick placed after bedpan was removed. Brief is in place. Linens changed and chux in place. Patient taken to CT scan.

## 2019-12-11 NOTE — H&P (Signed)
History and Physical    Krista Parker [id number] DOB: September 29, 1931 DOA: 12/11/2019  PCP: Leonel Ramsay, MD   Patient coming from: home I have personally briefly reviewed patient's old medical records in [place]  Chief Complaint: short ess of breath and recent pneumonia  HPI: Krista Parker is a 86 y.o. female with medical history significant for hypertension the, diagnosed with pneumonia 1 week prior initially failing treatment with Augmentin and subsequently with Levaquin who presented to the emergency room for continued shortness of breath in spite of outpatient treatment.  Received both Covid shots, last one on 12/02/2019 and tested negative for Covid on 12/07/2019.  She denied chest pain or chills  ED Course: On arrival she was afebrile, O2 sat was 91% on room air at rest and she was placed on 3 L with improvement to 98%.  Blood pressure was 183/102 and she was tachycardic to 100s.  RR 32.  White cell count was 16,000, BNP 278, troponin negative at six.  Sodium 128.  Lactic acid 1.7, procalcitonin less than 0.1.  Chest x-ray showed multifocal pneumonia with airspace opacity throughout much of the right lung and left upper lobe.  A chest negative for PE.  Patient was given multiple rounds of DuoNeb's as well as Solu-Medrol in the emergency room.  Due to continued increased work of breathing, hospitalist consulted for admission  Review of Systems: As per HPI otherwise 10 point review of systems negative.    Past Medical History:  Diagnosis Date  . Asthma   . History of abdominal pain   . Hypertension   . Pneumonia   . Rib pain   . Superficial basal cell carcinoma (BCC) 07/15/2016   right neck    History reviewed. No pertinent surgical history.   reports that she has quit smoking. She has never used smokeless tobacco. She reports current alcohol use. She reports that she does not use drugs.  No Known Allergies  No family history on file.   Prior to  Admission medications   Medication Sig Start Date End Date Taking? Authorizing Provider  acetaminophen (TYLENOL) 650 MG CR tablet Take 650 mg by mouth every 8 (eight) hours as needed for pain.    [provider]  Calcium-Vitamin D 500-125 MG-UNIT TABS Take 3 each by mouth daily.    [provider]  CALCIUM-VITAMIN D PO Take 3 each by mouth daily. Chew 2 daily    [provider]  diphenhydrAMINE (BENADRYL) 25 mg capsule Take 25 mg by mouth at bedtime as needed for sleep.     [provider]  HYDROcodone-acetaminophen (NORCO/VICODIN) 5-325 MG tablet Take 1-2 tablets by mouth every 6 (six) hours as needed for severe pain. 06/18/15   Little, Wenda Overland, MD  Naproxen Sodium 220 MG CAPS Take 2 tablets by mouth daily as needed. For pain    [provider]  omeprazole (PRILOSEC) 20 MG capsule Take 20 mg by mouth daily. 06/07/15   [provider]  polyethylene glycol powder (GLYCOLAX/MIRALAX) powder Take 17 g by mouth daily as needed for moderate constipation.     [provider]    Physical Exam: Vitals:   12/11/19 2012 12/11/19 2015 12/11/19 2021 12/11/19 2030  BP:  (!) 183/102  (!) 177/88  Pulse:  96  93  Resp:  (!) 32  (!) 22  Temp:  98.2 F (36.8 C)    TempSrc:  Oral    SpO2: 98% 95%  95%  Weight:  80.3 kg   Height:   5\' 2"  (1.575 m)      Vitals:   12/11/19 2012 12/11/19 2015 12/11/19 2021 12/11/19 2030  BP:  (!) 183/102  (!) 177/88  Pulse:  96  93  Resp:  (!) 32  (!) 22  Temp:  98.2 F (36.8 C)    TempSrc:  Oral    SpO2: 98% 95%  95%  Weight:   80.3 kg   Height:   5\' 2"  (1.575 m)     Constitutional: Alert and awake, oriented x3, in mild respiratory distress Eyes: PERLA, EOMI, irises appear normal, anicteric sclera,  ENMT: external ears and nose appear normal, normal hearing             Lips appears normal, oropharynx mucosa, tongue, posterior pharynx appear normal  Neck: neck appears normal, no masses, normal  ROM, no thyromegaly, no JVD  CVS: S1-S2 clear, no murmur rubs or gallops,  , no carotid bruits, pedal pulses palpable, No LE edema Respiratory: Apneic.  Diminished bilaterally with increased respiratory effort.  Few wheezes and rales scattered.  Accessory muscle use.  Abdomen: soft nontender, nondistended, normal bowel sounds, no hepatosplenomegaly, no hernias Musculoskeletal: : no cyanosis, clubbing , no contractures or atrophy Neuro: Cranial nerves II-XII intact, sensation, reflexes normal, strength Psych: judgement and insight appear normal, stable mood and affect, Skin: no rashes or lesions or ulcers, no induration or nodules   Labs on Admission: I have personally reviewed following labs and imaging studies  CBC: Recent Labs  Lab 12/11/19 2015  WBC 16.2*  NEUTROABS 14.0*  HGB 14.2  HCT 41.3  MCV 84.5  PLT 99991111*   Basic Metabolic Panel: Recent Labs  Lab 12/11/19 2015  NA 128*  K 3.7  CL 91*  CO2 25  GLUCOSE 110*  BUN 19  CREATININE 0.74  CALCIUM 9.7   GFR: Estimated Creatinine Clearance: 50.5 mL/min (by C-G formula based on SCr of 0.74 mg/dL). Liver Function Tests: Recent Labs  Lab 12/11/19 2015  AST 28  ALT 23  ALKPHOS 86  BILITOT 0.7  PROT 7.4  ALBUMIN 3.6   No results for input(s): LIPASE, AMYLASE in the last 168 hours. No results for input(s): AMMONIA in the last 168 hours. Coagulation Profile: No results for input(s): INR, PROTIME in the last 168 hours. Cardiac Enzymes: No results for input(s): CKTOTAL, CKMB, CKMBINDEX, TROPONINI in the last 168 hours. BNP (last 3 results) No results for input(s): PROBNP in the last 8760 hours. HbA1C: No results for input(s): HGBA1C in the last 72 hours. CBG: No results for input(s): GLUCAP in the last 168 hours. Lipid Profile: No results for input(s): CHOL, HDL, LDLCALC, TRIG, CHOLHDL, LDLDIRECT in the last 72 hours. Thyroid Function Tests: No results for input(s): TSH, T4TOTAL, FREET4, T3FREE, THYROIDAB in the  last 72 hours. Anemia Panel: No results for input(s): VITAMINB12, FOLATE, FERRITIN, TIBC, IRON, RETICCTPCT in the last 72 hours. Urine analysis: No results found for: COLORURINE, APPEARANCEUR, LABSPEC, PHURINE, GLUCOSEU, HGBUR, BILIRUBINUR, KETONESUR, PROTEINUR, UROBILINOGEN, NITRITE, LEUKOCYTESUR  Radiological Exams on Admission: CT Angio Chest PE W and/or Wo Contrast  Result Date: 12/11/2019 CLINICAL DATA:  86 year old female with shortness of breath. EXAM: CT ANGIOGRAPHY CHEST WITH CONTRAST TECHNIQUE: Multidetector CT imaging of the chest was performed using the standard protocol during bolus administration of intravenous contrast. Multiplanar CT image reconstructions and MIPs were obtained to evaluate the vascular anatomy. CONTRAST:  55mL OMNIPAQUE IOHEXOL 350 MG/ML SOLN COMPARISON:  Chest radiograph dated 06/11/2016. FINDINGS: Cardiovascular:  There is mild cardiomegaly. No pericardial effusion. Advanced 3 vessel coronary vascular calcification. There is mild atherosclerotic calcification of the thoracic aorta. No aneurysmal dilatation or dissection. The right vertebral artery appears limited. The remainder of the visualized great vessels of the aortic arch appear patent. No CT evidence of pulmonary artery embolism. Mediastinum/Nodes: There is no hilar or mediastinal adenopathy. There is a moderate hiatal hernia. No mediastinal fluid collection. Lungs/Pleura: There is background of centrilobular emphysema. Bilateral patchy and streaky airspace opacity and reticular densities most consistent with multifocal pneumonia. Clinical correlation and follow-up to resolution recommended. There is a small right pleural effusion. No pneumothorax. The central airways are patent. Upper Abdomen: A 1 cm left hepatic cyst and additional subcentimeter hypodensity PH is too small to characterize. Musculoskeletal: Osteopenia with degenerative changes of the spine and multilevel lower thoracic old appearing compression  fractures. No definite acute osseous pathology. Review of the MIP images confirms the above findings. IMPRESSION: 1. No CT evidence of pulmonary artery embolism. 2. Multifocal pneumonia. Clinical correlation and follow-up to resolution recommended. 3. Small right pleural effusion. 4. Mild cardiomegaly with advanced 3 vessel coronary vascular calcification. 5. Moderate hiatal hernia. 6. Aortic Atherosclerosis (ICD10-I70.0). Electronically Signed   By: Anner Crete M.D.   On: 12/11/2019 22:37   DG Chest Portable 1 View  Result Date: 12/11/2019 CLINICAL DATA:  Shortness of breath EXAM: PORTABLE CHEST 1 VIEW COMPARISON:  Chest CT June 11, 2016 FINDINGS: There is extensive airspace opacifications throughout much of the right upper and lower lung regions. Ill-defined opacity is also noted in the left upper lobe. Heart is upper normal in size with pulmonary vascularity normal. No adenopathy. No bone lesions. IMPRESSION: Multifocal pneumonia with airspace opacity throughout much of the right lung as well as left upper lobe. Heart is upper normal in size with pulmonary vascularity normal. No adenopathy. Electronically Signed   By: Lowella Grip III M.D.   On: 12/11/2019 20:51    EKG: Independently reviewed.   Assessment/Plan Principal Problem:   CAP (community acquired pneumonia)   COPD with acute exacerbation ([place])   Hypoxia -Patient failed outpatient therapy with Augmentin and Levaquin -IV azithromycin and Rocephin, though procalcitonin less than 0.1 -O2 to keep sats over 94% -IV fluids, mucolytic's, supportive care -Follow blood cultures -Follow Covid test -Patient received both Covid vaccines and tested negative for Covid a few days prior to presentation    Hypertensive urgency -Blood pressure 183/102 -No antihypertensive seen on home med list -Labetalol 10 mg IV as needed BP over 170/100    Hyponatremia -Sodium 0000000, uncertain etiology, possible hypovolemic -IV hydration with  normal saline and monitor -Follow urine and serum osmolality    DVT prophylaxis: Lovenox  Code Status: DNR Family Communication:  Son at bedside  Disposition Plan: Back to previous home environment Consults called: none  Status:inp    Athena Masse MD [facility]     12/11/2019, 11:49 PM

## 2019-12-11 NOTE — ED Notes (Signed)
Report given to Mac RN 

## 2019-12-11 NOTE — Progress Notes (Signed)
PHARMACY -  BRIEF ANTIBIOTIC NOTE   Pharmacy has received consult(s) for vancomycin from an ED provider.  The patient's profile has been reviewed for ht/wt/allergies/indication/available labs.    One time order(s) placed for vanc 1.5g IV load  Further antibiotics/pharmacy consults should be ordered by admitting physician if indicated.                       Thank you,  Tobie Lords, PharmD, BCPS Clinical Pharmacist 12/11/2019  10:31 PM

## 2019-12-11 NOTE — ED Provider Notes (Signed)
[facility] Emergency Department Provider Note  ____________________________________________   First MD Initiated Contact with Patient 12/11/19 2008     (approximate)  I have reviewed the triage vital signs and the nursing notes.   HISTORY  Chief Complaint Shortness of Breath    HPI Krista Parker is a 86 y.o. female with COPD who comes in with worsening shortness of breath.  Patient was diagnosed with pneumonia 1 week ago.  Patient was tested for Covid and was negative.  Patient was discharged on prednisone and Augmentin.  Patient is calling today for worsening shortness of breath, moderate, constant, nothing makes it better, worse with exertion.  Patient was noted to be 91% on room air at rest.  Patient placed on 3 L.  Patient did have her second dose of vaccine on 11/21/2019.           Past Medical History:  Diagnosis Date  . Asthma   . History of abdominal pain   . Hypertension   . Pneumonia   . Rib pain   . Superficial basal cell carcinoma (BCC) 07/15/2016   right neck    Patient Active Problem List   Diagnosis Date Noted  . Abnormal ECG 02/13/2014  . Murmur 02/13/2014  . History of abdominal pain   . Rib pain     History reviewed. No pertinent surgical history.  Prior to Admission medications   Medication Sig Start Date End Date Taking? Authorizing Provider  acetaminophen (TYLENOL) 650 MG CR tablet Take 650 mg by mouth every 8 (eight) hours as needed for pain.    [provider]  Calcium-Vitamin D 500-125 MG-UNIT TABS Take 3 each by mouth daily.    [provider]  CALCIUM-VITAMIN D PO Take 3 each by mouth daily. Chew 2 daily    [provider]  diphenhydrAMINE (BENADRYL) 25 mg capsule Take 25 mg by mouth at bedtime as needed for sleep.     [provider]  HYDROcodone-acetaminophen (NORCO/VICODIN) 5-325 MG tablet Take 1-2 tablets by mouth every 6 (six) hours as needed for severe pain.  06/18/15   Little, Wenda Overland, MD  Naproxen Sodium 220 MG CAPS Take 2 tablets by mouth daily as needed. For pain    [provider]  omeprazole (PRILOSEC) 20 MG capsule Take 20 mg by mouth daily. 06/07/15   [provider]  polyethylene glycol powder (GLYCOLAX/MIRALAX) powder Take 17 g by mouth daily as needed for moderate constipation.     [provider]    Allergies Patient has no known allergies.  No family history on file.  Social History Social History   Tobacco Use  . Smoking status: Former Research scientist (life sciences)  . Smokeless tobacco: Never Used  Substance Use Topics  . Alcohol use: Yes  . Drug use: No      Review of Systems Constitutional: No fever/chills, positive weakness Eyes: No visual changes. ENT: No sore throat. Cardiovascular: No chest pain Respiratory: Positive for SOB Gastrointestinal: No abdominal pain.  No nausea, no vomiting.  No diarrhea.  No constipation. Genitourinary: Negative for dysuria. Musculoskeletal: Negative for back pain. Skin: Negative for rash. Neurological: Negative for headaches, focal weakness or numbness. All other ROS negative ____________________________________________   PHYSICAL EXAM:  VITAL SIGNS: ED Triage Vitals  Enc Vitals Group     BP 12/11/19 2015 (!) 183/102     Pulse Rate 12/11/19 2015 96     Resp 12/11/19 2015 (!) 32     Temp 12/11/19 2015 98.2  F (36.8 C)     Temp Source 12/11/19 2015 Oral     SpO2 12/11/19 2012 98 %     Weight 12/11/19 2021 177 lb (80.3 kg)     Height 12/11/19 2021 5\' 2"  (1.575 m)     Head Circumference --      Peak Flow --      Pain Score 12/11/19 2021 0     Pain Loc --      Pain Edu? --      Excl. in [place]? --     Constitutional: Alert and oriented. Well appearing and in no acute distress. Eyes: Conjunctivae are normal. EOMI. Head: Atraumatic. Nose: No congestion/rhinnorhea. Mouth/Throat: Mucous membranes are moist.   Neck: No stridor. Trachea Midline.  FROM Cardiovascular: Normal rate, regular rhythm. Grossly normal heart sounds.  Good peripheral circulation. Respiratory: Expiratory wheezing, on 3 L of oxygen Gastrointestinal: Soft and nontender. No distention. No abdominal bruits.  Musculoskeletal: No lower extremity tenderness nor edema.  No joint effusions. Neurologic:  Normal speech and language. No gross focal neurologic deficits are appreciated.  Skin:  Skin is warm, dry and intact. No rash noted. Psychiatric: Mood and affect are normal. Speech and behavior are normal. GU: Deferred   ____________________________________________   LABS (all labs ordered are listed, but only abnormal results are displayed)  Labs Reviewed  CBC WITH DIFFERENTIAL/PLATELET - Abnormal; Notable for the following components:      Result Value   WBC 16.2 (*)    Platelets 499 (*)    Neutro Abs 14.0 (*)    Abs Immature Granulocytes 0.19 (*)    All other components within normal limits  COMPREHENSIVE METABOLIC PANEL - Abnormal; Notable for the following components:   Sodium 128 (*)    Chloride 91 (*)    Glucose, Bld 110 (*)    All other components within normal limits  BRAIN NATRIURETIC PEPTIDE - Abnormal; Notable for the following components:   B Natriuretic Peptide 278.0 (*)    All other components within normal limits  CULTURE, BLOOD (ROUTINE X 2)  CULTURE, BLOOD (ROUTINE X 2)  PROCALCITONIN  LACTIC ACID, PLASMA  PROCALCITONIN  LACTIC ACID, PLASMA  URINALYSIS, ROUTINE W REFLEX MICROSCOPIC  TROPONIN I (HIGH SENSITIVITY)  TROPONIN I (HIGH SENSITIVITY)   ____________________________________________   ED ECG REPORT I, Vanessa [place], the attending physician, personally viewed and interpreted this ECG.  EKG is sinus tachycardia rate of 108, no ST elevation, T wave inversion in lead III, normal intervals ____________________________________________  RADIOLOGY Robert Bellow, personally viewed and evaluated these images (plain radiographs) as  part of my medical decision making, as well as reviewing the written report by the radiologist.  ED MD interpretation: Opacifications bilaterally.  Official radiology report(s): DG Chest Portable 1 View  Result Date: 12/11/2019 CLINICAL DATA:  Shortness of breath EXAM: PORTABLE CHEST 1 VIEW COMPARISON:  Chest CT June 11, 2016 FINDINGS: There is extensive airspace opacifications throughout much of the right upper and lower lung regions. Ill-defined opacity is also noted in the left upper lobe. Heart is upper normal in size with pulmonary vascularity normal. No adenopathy. No bone lesions. IMPRESSION: Multifocal pneumonia with airspace opacity throughout much of the right lung as well as left upper lobe. Heart is upper normal in size with pulmonary vascularity normal. No adenopathy. Electronically Signed   By: Lowella Grip III M.D.   On: 12/11/2019 20:51    ____________________________________________   PROCEDURES  Procedure(s) performed (including Critical Care):  .Critical  Care Performed by: Vanessa Maurice, MD Authorized by: Vanessa Austell, MD   Critical care provider statement:    Critical care time (minutes):  35   Critical care was necessary to treat or prevent imminent or life-threatening deterioration of the following conditions:  Respiratory failure   Critical care was time spent personally by me on the following activities:  Discussions with consultants, evaluation of patient's response to treatment, examination of patient, ordering and performing treatments and interventions, ordering and review of laboratory studies, ordering and review of radiographic studies, pulse oximetry, re-evaluation of patient's condition, obtaining history from patient or surrogate and review of old charts     ____________________________________________   INITIAL IMPRESSION / ASSESSMENT AND PLAN / ED COURSE   Krista Parker was evaluated in Emergency Department on 12/11/2019 for the  symptoms described in the history of present illness. She was evaluated in the context of the global COVID-19 pandemic, which necessitated consideration that the patient might be at risk for infection with the SARS-CoV-2 virus that causes COVID-19. Institutional protocols and algorithms that pertain to the evaluation of patients at risk for COVID-19 are in a state of rapid change based on information released by regulatory bodies including the CDC and federal and state organizations. These policies and algorithms were followed during the patient's care in the ED.     Pt presents with SOB. Differential includes: PNA-will get xray to evaluation Anemia-CBC to evaluate ACS- will get trops Arrhythmia-Will get EKG and keep on monitor.  COVID- will get testing per algorithm.  Labs show elevated white count of 16 although patient is on steroids.  Patient also shows some signs of dehydration with sodium of 128 chloride of 91.  We will give some normal saline.  X-ray concerning for bilateral pneumonia.  Given patient's respiratory rate and white count will start on antibiotics.  Procalcitonin resulted as negative.  Will get a CT PE to make sure is nothing else going on including pulmonary embolism.  Since I think Covid is less likely given she has been vaccinated.  Patient desats down to 88% on room air.  Patient require admission.  ____________________________________________   FINAL CLINICAL IMPRESSION(S) / ED DIAGNOSES   Final diagnoses:  COPD exacerbation ([place])  Pneumonia of both lungs due to infectious organism, unspecified part of lung  Acute respiratory failure with hypoxia ([place])     MEDICATIONS GIVEN DURING THIS VISIT:  Medications  vancomycin (VANCOCIN) IVPB 1000 mg/200 mL premix (has no administration in time range)  cefTRIAXone (ROCEPHIN) 2 g in sodium chloride 0.9 % 100 mL IVPB (has no administration in time range)  azithromycin (ZITHROMAX) 500 mg in sodium chloride 0.9 % 250 mL  IVPB (has no administration in time range)  sodium chloride 0.9 % bolus 1,000 mL (has no administration in time range)  ipratropium-albuterol (DUONEB) 0.5-2.5 (3) MG/3ML nebulizer solution 3 mL (3 mLs Nebulization Given 12/11/19 2111)  ipratropium-albuterol (DUONEB) 0.5-2.5 (3) MG/3ML nebulizer solution 3 mL (3 mLs Nebulization Given 12/11/19 2111)  ipratropium-albuterol (DUONEB) 0.5-2.5 (3) MG/3ML nebulizer solution 3 mL (3 mLs Nebulization Given 12/11/19 2111)  methylPREDNISolone sodium succinate (SOLU-MEDROL) 125 mg/2 mL injection 125 mg (125 mg Intravenous Given 12/11/19 2110)  iohexol (OMNIPAQUE) 350 MG/ML injection 75 mL (75 mLs Intravenous Contrast Given 12/11/19 2210)     ED Discharge Orders    None       Note:  This document was prepared using Dragon voice recognition software and may include unintentional dictation errors.   Marjean Donna  E, MD 12/11/19 2225

## 2019-12-12 ENCOUNTER — Other Ambulatory Visit: Payer: Self-pay

## 2019-12-12 ENCOUNTER — Encounter: Payer: Self-pay | Admitting: Internal Medicine

## 2019-12-12 DIAGNOSIS — I351 Nonrheumatic aortic (valve) insufficiency: Secondary | ICD-10-CM | POA: Diagnosis present

## 2019-12-12 DIAGNOSIS — J9601 Acute respiratory failure with hypoxia: Secondary | ICD-10-CM

## 2019-12-12 DIAGNOSIS — I340 Nonrheumatic mitral (valve) insufficiency: Secondary | ICD-10-CM | POA: Diagnosis present

## 2019-12-12 LAB — URINALYSIS, ROUTINE W REFLEX MICROSCOPIC
Bilirubin Urine: NEGATIVE
Glucose, UA: NEGATIVE mg/dL
Hgb urine dipstick: NEGATIVE
Ketones, ur: 5 mg/dL — AB
Leukocytes,Ua: NEGATIVE
Nitrite: NEGATIVE
Protein, ur: NEGATIVE mg/dL
Specific Gravity, Urine: 1.02 (ref 1.005–1.030)
pH: 7 (ref 5.0–8.0)

## 2019-12-12 LAB — BASIC METABOLIC PANEL
Anion gap: 12 (ref 5–15)
BUN: 17 mg/dL (ref 8–23)
CO2: 22 mmol/L (ref 22–32)
Calcium: 9.1 mg/dL (ref 8.9–10.3)
Chloride: 97 mmol/L — ABNORMAL LOW (ref 98–111)
Creatinine, Ser: 0.65 mg/dL (ref 0.44–1.00)
GFR calc Af Amer: >60 mL/min (ref >60)
GFR calc non Af Amer: >60 mL/min (ref >60)
Glucose, Bld: 122 mg/dL — ABNORMAL HIGH (ref 70–99)
Potassium: 3.5 mmol/L (ref 3.5–5.1)
Sodium: 131 mmol/L — ABNORMAL LOW (ref 135–145)

## 2019-12-12 LAB — OSMOLALITY, URINE: Osmolality, Ur: 403 mOsm/kg (ref 300–900)

## 2019-12-12 LAB — TROPONIN I (HIGH SENSITIVITY): Troponin I (High Sensitivity): 8 ng/L (ref <18)

## 2019-12-12 LAB — RESPIRATORY PANEL BY RT PCR (FLU A&B, COVID)
Influenza A by PCR: NEGATIVE
Influenza B by PCR: NEGATIVE
SARS Coronavirus 2 by RT PCR: NEGATIVE

## 2019-12-12 LAB — OSMOLALITY: Osmolality: 275 mOsm/kg (ref 275–295)

## 2019-12-12 LAB — PROCALCITONIN: Procalcitonin: <0.1 ng/mL

## 2019-12-12 MED ORDER — METOPROLOL SUCCINATE ER 50 MG PO TB24
50.0000 mg | ORAL_TABLET | Freq: Every day | ORAL | Status: DC
  Administered 2019-12-12 – 2019-12-18 (×7): 50 mg via ORAL
  Filled 2019-12-12 (×7): qty 1

## 2019-12-12 MED ORDER — PANTOPRAZOLE SODIUM 40 MG PO TBEC
40.0000 mg | DELAYED_RELEASE_TABLET | Freq: Two times a day (BID) | ORAL | Status: DC
  Administered 2019-12-12 – 2019-12-18 (×12): 40 mg via ORAL
  Filled 2019-12-12 (×12): qty 1

## 2019-12-12 MED ORDER — ACETAMINOPHEN 325 MG PO TABS
650.0000 mg | ORAL_TABLET | Freq: Four times a day (QID) | ORAL | Status: DC | PRN
  Administered 2019-12-12 – 2019-12-18 (×15): 650 mg via ORAL
  Filled 2019-12-12 (×15): qty 2

## 2019-12-12 MED ORDER — NON FORMULARY: 5.0000 mg | Freq: Every evening | Status: DC | PRN

## 2019-12-12 MED ORDER — ALUM & MAG HYDROXIDE-SIMETH 200-200-20 MG/5ML PO SUSP
30.0000 mL | ORAL | Status: DC | PRN
  Administered 2019-12-12 – 2019-12-17 (×9): 30 mL via ORAL
  Filled 2019-12-12 (×9): qty 30

## 2019-12-12 MED ORDER — MELATONIN 5 MG PO TABS
5.0000 mg | ORAL_TABLET | Freq: Every evening | ORAL | Status: DC | PRN
  Filled 2019-12-12 (×2): qty 1

## 2019-12-12 MED ORDER — LISINOPRIL 10 MG PO TABS
10.0000 mg | ORAL_TABLET | Freq: Every day | ORAL | Status: DC
  Administered 2019-12-12 – 2019-12-18 (×7): 10 mg via ORAL
  Filled 2019-12-12 (×7): qty 1

## 2019-12-12 NOTE — Evaluation (Signed)
Physical Therapy Evaluation Patient Details Name: Krista Parker MRN: [id number] DOB: 1932-06-29 Today's Date: 12/12/2019   History of Present Illness  Krista Parker is a 86 y.o. female with medical history significant for hypertension the, diagnosed with pneumonia 1 week prior who presented to the emergency room for continued shortness of breath in spite of outpatient treatment; admitted for inpatient management of multifocal PNA.  Received both Covid shots, last one on 12/02/2019 and tested negative for Covid on 12/07/2019  Clinical Impression  Upon evaluation, patient alert and oriented; follows commands and demonstrates good effort with mobility tasks. Eager for OOB/mobility efforts.  Bilat UE/LE strength and ROM grossly symmetrical and WFL, except baseline R shoulder ROM deficits (ortho/RTC injury due to previous falls).  Denies pain.  Able to complete sit/stand, basic transfers and gait (82') with RW, cga/min assist.  Demonstrates reciprocal stepping pattern with fair/good step height/length; decreased cadence, but no overt buckling, LOB.  Mild forward trunk flexion, improved temporarily with cuing.  Mod SOB (BORG 5-6/10) and desat to 84-85% noted with gait distance, recovering >92% on 2L with 30-45 sec seated rest and pursed lip breathing.  Additional activity deferred at this time due to SOB with exertion; will continue to assess/progress as appropriate. Would benefit from skilled PT to address above deficits and promote optimal return to PLOF.; Recommend transition to HHPT upon discharge from acute hospitalization.     Follow Up Recommendations Home health PT    Equipment Recommendations  Rolling walker with 5" wheels;3in1 (PT)    Recommendations for Other Services       Precautions / Restrictions Precautions Precautions: Fall Restrictions Weight Bearing Restrictions: No      Mobility  Bed Mobility Overal bed mobility: Needs Assistance Bed Mobility: Supine to Sit     Supine to sit: Supervision;HOB elevated     General bed mobility comments: seated in recliner beginning/end of treatment  Transfers Overall transfer level: Needs assistance Equipment used: Rolling walker (2 wheeled) Transfers: Sit to/from Stand Sit to Stand: Min guard;Supervision Stand pivot transfers: Min guard       General transfer comment: cuing for hand placement; fair/good LE strength and power  Ambulation/Gait Ambulation/Gait assistance: Min guard;Supervision Gait Distance (Feet): 70 Feet Assistive device: Rolling walker (2 wheeled)       General Gait Details: reciprocal stepping pattern with fair/good step height/length; decreased cadence, but no overt buckling, LOB.  Mild forward trunk flexion, improved temporarily with cuing.  Mod SOB (BORG 5-6/10) and desat to 84-85% noted with gait distance, recovering >92% on 2L with 30-45 sec seated rest and pursed lip breathing.  Stairs            Wheelchair Mobility    Modified Rankin (Stroke Patients Only)       Balance Overall balance assessment: Needs assistance Sitting-balance support: No upper extremity supported;Feet supported Sitting balance-Leahy Scale: Good     Standing balance support: Bilateral upper extremity supported Standing balance-Leahy Scale: Fair                               Pertinent Vitals/Pain Pain Assessment: No/denies pain    Home Living Family/patient expects to be discharged to:: ([facility] apartments at [facility]) Living Arrangements: Children;Other relatives   Type of Home: Independent living facility       Home Layout: One level Home Equipment: Shower seat;Grab bars - tub/shower Additional Comments: Patient reports she used no AD/DME prior to hospitalization.  Prior Function Level of Independence: Independent         Comments: Patient states she used no AD for functional mobility and was I with all (B/I)ADLs.     Hand Dominance    Dominant Hand: Right    Extremity/Trunk Assessment   Upper Extremity Assessment Upper Extremity Assessment: (R shoulder ROM deficits (chronic ortho/RTC injury), otherwise grossly WFL) RUE Deficits / Details: Patient with some soreness in R shoulder due to previous fall RUE Sensation: WNL RUE Coordination: WNL    Lower Extremity Assessment Lower Extremity Assessment: Overall WFL for tasks assessed(grossly at least 4+/5 throughout)       Communication   Communication: No difficulties  Cognition Arousal/Alertness: Awake/alert Behavior During Therapy: WFL for tasks assessed/performed Overall Cognitive Status: Within Functional Limits for tasks assessed                                 General Comments: A&O x 4, cooperative and pleasant      General Comments General comments (skin integrity, edema, etc.): Patient is motiviated to participate.  Patient becomes very SOB even with conversation.  Patient also notes her R UE is sore due to previous fall.    Exercises Other Exercises Other Exercises: Introduced education regarding pursed lip breathing, home safety modifications, energy conservation/activity pacing, use of RW (for energy conservation); encouraged transition to [facility] for toileting (vs purewick); patient voiced understanding.  Will continue to reinforce in subsequent treatment sessions. Other Exercises: Provided education on general safety using bed controls, call light, phone, etc Other Exercises: Provided education on safety, sequencing, body mechanics, etc during functional transfers with use of 2WW Other Exercises: Provided education on breathing techniques to alleviate SOB   Assessment/Plan    PT Assessment Patient needs continued PT services  PT Problem List Decreased strength;Decreased activity tolerance;Decreased balance;Decreased mobility;Decreased range of motion;Cardiopulmonary status limiting activity;Decreased knowledge of use of DME       PT  Treatment Interventions DME instruction;Gait training;Stair training;Functional mobility training;Therapeutic activities;Therapeutic exercise;Patient/family education    PT Goals (Current goals can be found in the Care Plan section)  Acute Rehab PT Goals Patient Stated Goal: "I want to catch my breath better" PT Goal Formulation: With patient Time For Goal Achievement: 12/26/19 Potential to Achieve Goals: Good    Frequency Min 2X/week   Barriers to discharge        Co-evaluation               AM-PAC PT "6 Clicks" Mobility  Outcome Measure Help needed turning from your back to your side while in a flat bed without using bedrails?: None Help needed moving from lying on your back to sitting on the side of a flat bed without using bedrails?: None Help needed moving to and from a bed to a chair (including a wheelchair)?: A Little Help needed standing up from a chair using your arms (e.g., wheelchair or bedside chair)?: A Little Help needed to walk in hospital room?: A Little Help needed climbing 3-5 steps with a railing? : A Little 6 Click Score: 20    End of Session Equipment Utilized During Treatment: Gait belt;Oxygen Activity Tolerance: Patient tolerated treatment well Patient left: in chair;with call bell/phone within reach;with chair alarm set Nurse Communication: Mobility status PT Visit Diagnosis: Muscle weakness (generalized) (M62.81);Difficulty in walking, not elsewhere classified (R26.2)    Time: [id number] PT Time Calculation (min) (ACUTE ONLY): 34 min   Charges:  PT Evaluation $PT Eval Moderate Complexity: 1 Mod PT Treatments $Therapeutic Activity: 8-22 mins       Reghan Thul H. Owens Shark, PT, DPT, NCS 12/12/19, 11:32 AM [phone]

## 2019-12-12 NOTE — ED Notes (Addendum)
Report off to [employer]

## 2019-12-12 NOTE — TOC Initial Note (Signed)
Transition of Care [facility]) - Initial/Assessment Note    Patient Details  Name: Krista Parker MRN: [id number] Date of Birth: Aug 30, 1932  Transition [facility] CM/SW Contact:    Elease Hashimoto, LCSW Phone Number: 12/12/2019, 1:58 PM  Clinical Narrative:  Met with pt and son-Eugene who is here at the bedside to discuss discharge needs. Pt wants to hold off on rw and bsc due to she wants to get better and get back to walking 2 miles a day. Her oxygen is new and she is hopeful she will not need this either. Son reports pt lives alone and was independent prior to admission and is a stubborn woman and will push herself. Son has offered for her to come stay with him and his family if needed. Will see in am to see if still needs equipment and follow up along with O2. Pt was driving prior to admission and has a PCP and insurance. Will see tomorrow to see if still needs equipment and follow up, due to pt wants to think about also. See in am. Son is in agreement with this.               Expected Discharge Plan: [place] Barriers to Discharge: Continued Medical Work up   Patient Goals and CMS Choice Patient states their goals for this hospitalization and ongoing recovery are:: I want to get back to where I was walking two miles a day   Choice offered to / list presented to : Patient  Expected Discharge Plan and Services Expected Discharge Plan: [place] In-house Referral: Clinical Social Work   Post Acute Care Choice: Home Health, Durable Medical Equipment Living arrangements for the past 2 months: [place]                                      Prior Living Arrangements/Services Living arrangements for the past 2 months: Single Family Home Lives with:: Self          Need for Family Participation in Patient Care: No (Comment) Care giver support system in place?: No (comment)      Activities of Daily Living Home Assistive  Devices/Equipment: Eyeglasses, Dentures (specify type) ADL Screening (condition at time of admission) Patient's cognitive ability adequate to safely complete daily activities?: Yes Is the patient deaf or have difficulty hearing?: No Does the patient have difficulty seeing, even when wearing glasses/contacts?: No Does the patient have difficulty concentrating, remembering, or making decisions?: No Patient able to express need for assistance with ADLs?: Yes Does the patient have difficulty dressing or bathing?: No Independently performs ADLs?: Yes (appropriate for developmental age) Does the patient have difficulty walking or climbing stairs?: No Weakness of Legs: None Weakness of Arms/Hands: None  Permission Sought/Granted   Permission granted to share information with : Yes, Verbal Permission Granted  Share Information with NAME: Cornelia Copa     Permission granted to share info w Relationship: son     Emotional Assessment Appearance:: Appears younger than stated age Attitude/Demeanor/Rapport: Engaged, Self-Confident Affect (typically observed): Adaptable, Accepting Orientation: : Oriented to Self, Oriented to Place, Oriented to  Time, Oriented to Situation      Admission diagnosis:  COPD exacerbation ([place]) [J44.1] CAP (community acquired pneumonia) [J18.9] Acute respiratory failure with hypoxia ([place]) [J96.01] Pneumonia of both lungs due to infectious organism, unspecified part of lung [J18.9] Patient Active  Problem List   Diagnosis Date Noted  . Acute respiratory failure with hypoxia ([place]) 12/12/2019  . Aortic insufficiency   . Mitral regurgitation   . COPD with acute exacerbation ([place]) 12/11/2019  . CAP (community acquired pneumonia) 12/11/2019  . Essential hypertension 12/11/2019  . Hypoxia 12/11/2019  . Hypertensive urgency 12/11/2019  . Hyponatremia 12/11/2019  . Abnormal ECG 02/13/2014  . Murmur 02/13/2014  . History of abdominal pain   . Rib pain    PCP:  Leonel Ramsay, MD Pharmacy:   [id number] [id number]: [phone]: [phone]    Social Determinants of Health (SDOH) Interventions    Readmission Risk Interventions No flowsheet data found.

## 2019-12-12 NOTE — Progress Notes (Signed)
Nutrition Brief Note  RD received consult for assessment of nutrition requirements/status per COPD protocol  Wt Readings from Last 15 Encounters:  12/12/19 80.8 kg  06/18/15 78.5 kg  02/13/14 81.2 kg   Patient reports her appetite is unchanged from baseline and she is eating well here. She reports she is eating more here than she typically does at home. She ate 90% of breakfast this morning. Patient's UBW is 176-177 lbs. Patient is weight-stable. She is currently 80.8 kg (178.13 lbs). Patient does not meet criteria for malnutrition at this time.  Body mass index is 32.58 kg/m. Patient meets criteria for obesity class I based on current BMI.   Current diet order is heart healthy, patient is consuming approximately 90% of meals at this time. Labs and medications reviewed.   No nutrition interventions warranted at this time. If nutrition issues arise, please consult RD.   Jacklynn Barnacle, MS, RD, LDN Pager number available on Amion

## 2019-12-12 NOTE — ED Notes (Signed)
Pt alert  Iv meds infusing.

## 2019-12-12 NOTE — Progress Notes (Signed)
SATURATION QUALIFICATIONS: (This note is used to comply with regulatory documentation for home oxygen)   Patient Saturations on 2 Liters of oxygen while Ambulating =82% Patient Saturations at rest on 2L of oxygen=93%  Please briefly explain why patient needs home oxygen:

## 2019-12-12 NOTE — Evaluation (Signed)
Occupational Therapy Evaluation Patient Details Name: Krista Parker MRN: [id number] DOB: [birth year] Today's Date: 12/12/2019    History of Present Illness Krista Parker is a 86 y.o. female with medical history significant for hypertension the, diagnosed with pneumonia 1 week prior who presented to the emergency room for continued shortness of breath in spite of outpatient treatment.  Received both Covid shots, last one on 12/02/2019 and tested negative for Covid on 12/07/2019   Clinical Impression   Patient approached in AM and was in bed, finishing breakfast.  Patient agreeable to therapy.  Patient reports no pain, only slight soreness in R UE due to previous fall.  Patient was previously living at [place] alone and was I with all BADLs and functional mobility.  Patient reports using no AD for functional mobility and did not use supplemental 02.  Patient presents with heavy SOB and RR ranged from 23-37 during conversation and functional tasks.  Vitals at 173/79 and 94-107 HR depending on activity level; 02 on 3L remained >90%.   Patient with no complaints of dizziness or lightheadedness.  Performed bed mobility with SBA and extra time with cues on safety regarding leads/wires.  Performed sit<>stand and SPT using RW with CGA.  Requires verbal/visual/tactile cues for appropriate usage of RW and body mechanics during functional transfer.  Provided education on role and goals of OT in acute care setting.  Verbalized understanding.  Patient would benefit from further occupational therapy services to address deficits in performance components outlined below.  Patient states she would like to return home upon e/c, but due to decreased caregiver support and decreased activity tolerance, recommending SNF at d/c at this time.  Will continue to monitor and update recommendation if appropriate.      Follow Up Recommendations  SNF    Equipment Recommendations  Other (comment)(defer to  next level of care)    Recommendations for Other Services       Precautions / Restrictions Precautions Precautions: Fall Restrictions Weight Bearing Restrictions: No      Mobility Bed Mobility Overal bed mobility: Needs Assistance Bed Mobility: Supine to Sit     Supine to sit: Supervision;HOB elevated     General bed mobility comments: SPV and extra time d/t SOB.  Requires cues for leads/wires  Transfers Overall transfer level: Needs assistance Equipment used: Rolling walker (2 wheeled) Transfers: Sit to/from [employer] Transfers Sit to Stand: Min guard;From elevated surface Stand pivot transfers: Min guard       General transfer comment: Requires cues for sequencing on body mechanics and hand placement.  Patient is not accustomed to using RW    Balance Overall balance assessment: Mild deficits observed, not formally tested;History of Falls                                         ADL either performed or assessed with clinical judgement   ADL Overall ADL's : Needs assistance/impaired     Grooming: Wash/dry hands;Wash/dry face;Oral care;Brushing hair;Set up;Sitting               Lower Body Dressing: Minimal assistance;Sit to/from stand Lower Body Dressing Details (indicate cue type and reason): MIN A d/t SOB when bending over Toilet Transfer: Min guard;RW;Stand-pivot   Toileting- Water quality scientist and Hygiene: Min guard;Sit to/from stand       Functional mobility during ADLs: Min guard;Rolling walker General ADL Comments: Patient  states she is not used to using RW and would prefer not to, however, she benefits from using it d/t poor activity tolerance and SOB     Vision Baseline Vision/History: Wears glasses Patient Visual Report: No change from baseline       Perception     Praxis      Pertinent Vitals/Pain Pain Assessment: No/denies pain     Hand Dominance Right   Extremity/Trunk Assessment Upper Extremity  Assessment Upper Extremity Assessment: Generalized weakness;RUE deficits/detail RUE Deficits / Details: Patient with some soreness in R shoulder due to previous fall RUE Sensation: WNL RUE Coordination: WNL   Lower Extremity Assessment Lower Extremity Assessment: Defer to PT evaluation       Communication Communication Communication: No difficulties   Cognition Arousal/Alertness: Awake/alert Behavior During Therapy: WFL for tasks assessed/performed Overall Cognitive Status: Within Functional Limits for tasks assessed                                 General Comments: A&O x 4, cooperative and pleasant   General Comments  Patient is motiviated to participate.  Patient becomes very SOB even with conversation.  Patient also notes her R UE is sore due to previous fall.    Exercises Other Exercises Other Exercises: Provided education on role and goals of OT in acute care Other Exercises: Provided education on general safety using bed controls, call light, phone, etc Other Exercises: Provided education on safety, sequencing, body mechanics, etc during functional transfers with use of 2WW Other Exercises: Provided education on breathing techniques to alleviate SOB   Shoulder Instructions      Home Living Family/patient expects to be discharged to:: Other (Comment)(Independent [place])     Type of Home: Independent living facility       Home Layout: One level     Bathroom Shower/Tub: Tub/shower unit         Home Equipment: Shower seat;Grab bars - tub/shower   Additional Comments: Patient reports she used no AD/DME prior to hospitalization.      Prior Functioning/Environment Level of Independence: Independent        Comments: Patient states she used no AD for functional mobility and was I with all (B/I)ADLs.        OT Problem List: Decreased strength;Decreased activity tolerance;Decreased knowledge of use of DME or AE       OT Treatment/Interventions: Self-care/ADL training;Therapeutic exercise;Energy conservation;DME and/or AE instruction;Therapeutic activities;Patient/family education    OT Goals(Current goals can be found in the care plan section) Acute Rehab OT Goals Patient Stated Goal: "I want to catch my breath better" OT Goal Formulation: With patient Time For Goal Achievement: 12/26/19 Potential to Achieve Goals: Good  OT Frequency: Min 1X/week   Barriers to D/C: Decreased caregiver support  Patient lives alone at [place].       Co-evaluation              AM-PAC OT "6 Clicks" Daily Activity     Outcome Measure Help from another person eating meals?: None Help from another person taking care of personal grooming?: None Help from another person toileting, which includes using toliet, bedpan, or urinal?: A Little Help from another person bathing (including washing, rinsing, drying)?: A Little Help from another person to put on and taking off regular upper body clothing?: None Help from another person to put on and taking off regular lower body clothing?: A  Little 6 Click Score: 21   End of Session Equipment Utilized During Treatment: Gait belt;Rolling walker Nurse Communication: Other (comment)(Communicated pt is in recliner with all needs in reach)  Activity Tolerance: Patient tolerated treatment well Patient left: in chair;with call bell/phone within reach;with chair alarm set  OT Visit Diagnosis: Unsteadiness on feet (R26.81);Muscle weakness (generalized) (M62.81);History of falling (Z91.81)                Time: [id number] OT Time Calculation (min): 36 min Charges:  OT General Charges $OT Visit: 1 Visit OT Evaluation $OT Eval Low Complexity: 1 Low OT Treatments $Therapeutic Activity: 23-37 mins  Baldomero Lamy, MS, OTR/L 12/12/19, 11:01 AM

## 2019-12-12 NOTE — Progress Notes (Addendum)
Progress Note    Krista Parker  [id number] DOB: [birth year]  DOA: 12/11/2019 PCP: Leonel Ramsay, MD    Brief Narrative:   Chief complaint: [place] records reviewed and are as summarized below:  Krista Parker is an 86 y.o. female with a past medical history that includes hypertension, ongoing dyspnea with exertion, recent pneumonia diagnosis on antibiotics, heart murmur, osteoporosis presented to the emergency department March 23 of complaint persistent worsening shortness of breath.  Work-up in the emergency department revealed increased oxygen demand with oxygen saturation level 90% on room air and tachypnea with movement, hypertension, tachycardia, leukocytosis and hyponatremia as well as multifocal pneumonia with airspace opacity throughout much of the right lung as well as left upper lobe.  She was provided with IV antibiotics as well as nebulizers and oxygen supplementation.  Assessment/Plan:   Principal Problem:   Acute respiratory failure with hypoxia (HCC) Active Problems:   COPD with acute exacerbation (HCC)   CAP (community acquired pneumonia)   Hypertensive urgency   Hyponatremia   Aortic insufficiency   Mitral regurgitation  #1.  Acute respiratory failure with hypoxia secondary to community-acquired pneumonia in the setting of a mild COPD exacerbation.  X-ray as noted above.  She is afebrile hemodynamically stable nontoxic-appearing.  Lactic acid within the limits of normal and procalcitonin less than 0.1.  Covid negative.  Oxygen saturation level 95% on 2 L.  Patient failed outpatient therapy with Augmentin and Levaquin.  She does not wear oxygen at home. , IV azithromycin and Rocephin were initiated. -Continue IV antibiotics -Nebulizers as needed -Follow blood cultures -Follow sputum culture -Monitor oxygen saturation level -Supplemental oxygen as indicated to keep sats greater than 94% -Wean oxygen as able -Incentive  spirometry  #2.  Hypertensive urgency.  On admission blood pressure was 183/102.  Home medications include lisinopril and Toprol.  -We will resume home meds -Monitor  #3.  Hyponatremia.  Sodium level 128 on admission.  Etiology unclear.  May be related to medications in the setting of hypovolemia. -Follow urine and serum osmolality -Recheck this morning -Adjust IV fluids as indicated by today's labs  #4.  Dyspnea with exertion/aortic insufficiency/mitral regurg.  Chart review in care everywhere reveals patient has recently been evaluated by cardiology for persistent dyspnea on exertion.  Echo with an EF of 60% and grade 1 diastolic dysfunction.  Note also indicates nonrheumatic mitral valve regurg and afterload reduction added recommended low-sodium diet. cardiology's note dated 2 weeks ago indicates plan to evaluate for pulmonary etiology for dyspnea with exertion with a PA and lateral.  Unclear if this x-ray was done but I could not pull up the results.  Scheduled to follow-up April 13. -Treat #1. -Incentive spirometry  Attestation:  The patient was seen and examined independently, labs work-up previous notes were all reviewed independently.  Current management, IV antibiotics, DuoNeb bronchodilator treatments.  Resuming home medications  Plan of care was discussed with the patient and APP in detail. Dr. Roger Shelter      Family Communication/Anticipated D/C date and plan/Code Status   DVT prophylaxis: Lovenox ordered. Code Status: Full Code.  Family Communication: patient Disposition Plan: home when ready   Medical Consultants:   None.   Anti-Infectives:   None  Subjective:  Sitting on side of bed about to begin work with physical therapy.  Moderate increased work of breathing.  Denies pain or discomfort but continues to feel very short of breath  Objective:    Vitals:   12/12/19 0630  12/12/19 0715 12/12/19 0823 12/12/19 0920  BP:   (!) 152/85 (!) 173/79  Pulse:    97   Resp: 20  20 20   Temp:   98.1 F (36.7 C)   TempSrc:      SpO2:  92% 95%   Weight:      Height:        Intake/Output Summary (Last 24 hours) at 12/12/2019 1343 Last data filed at 12/12/2019 0949 Gross per 24 hour  Intake 1691.88 ml  Output 750 ml  Net 941.88 ml   Filed Weights   12/11/19 2021 12/12/19 0550  Weight: 80.3 kg 80.8 kg    Exam: General: Obese sitting on the side of bed no acute distress CV: Regular rate and rhythm no lower extremity edema Respiratory: Moderate increased work of breathing with exertion and conversation.  Breath sounds diminished bilaterally Abdomen: Obese soft positive bowel sounds throughout nontender to palpation Musculoskeletal: Joints without swelling/erythema Neuro: Awake alert oriented x3 speech clear  Data Reviewed:   I have personally reviewed following labs and imaging studies:  Labs: Labs show the following:   Basic Metabolic Panel: Recent Labs  Lab 12/11/19 2015  NA 128*  K 3.7  CL 91*  CO2 25  GLUCOSE 110*  BUN 19  CREATININE 0.74  CALCIUM 9.7   GFR Estimated Creatinine Clearance: 50.6 mL/min (by C-G formula based on SCr of 0.74 mg/dL). Liver Function Tests: Recent Labs  Lab 12/11/19 2015  AST 28  ALT 23  ALKPHOS 86  BILITOT 0.7  PROT 7.4  ALBUMIN 3.6   No results for input(s): LIPASE, AMYLASE in the last 168 hours. No results for input(s): AMMONIA in the last 168 hours. Coagulation profile No results for input(s): INR, PROTIME in the last 168 hours.  CBC: Recent Labs  Lab 12/11/19 2015  WBC 16.2*  NEUTROABS 14.0*  HGB 14.2  HCT 41.3  MCV 84.5  PLT 499*   Cardiac Enzymes: No results for input(s): CKTOTAL, CKMB, CKMBINDEX, TROPONINI in the last 168 hours. BNP (last 3 results) No results for input(s): PROBNP in the last 8760 hours. CBG: No results for input(s): GLUCAP in the last 168 hours. D-Dimer: No results for input(s): DDIMER in the last 72 hours. Hgb A1c: No results for input(s):  HGBA1C in the last 72 hours. Lipid Profile: No results for input(s): CHOL, HDL, LDLCALC, TRIG, CHOLHDL, LDLDIRECT in the last 72 hours. Thyroid function studies: No results for input(s): TSH, T4TOTAL, T3FREE, THYROIDAB in the last 72 hours.  Invalid input(s): FREET3 Anemia work up: No results for input(s): VITAMINB12, FOLATE, FERRITIN, TIBC, IRON, RETICCTPCT in the last 72 hours. Sepsis Labs: Recent Labs  Lab 12/11/19 2015 12/11/19 2324 12/12/19 0244  PROCALCITON <0.10  --  <0.10  WBC 16.2*  --   --   LATICACIDVEN 1.7 1.1  --     Microbiology Recent Results (from the past 240 hour(s))  Blood culture (routine x 2)     Status: None (Preliminary result)   Collection Time: 12/11/19  8:15 PM   Specimen: BLOOD  Result Value Ref Range Status   Specimen Description BLOOD RIGHT ANTECUBITAL  Final   Special Requests   Final    BOTTLES DRAWN AEROBIC AND ANAEROBIC Blood Culture results may not be optimal due to an excessive volume of blood received in culture bottles   Culture   Final    NO GROWTH < 12 HOURS Performed at [facility], [address], [place], [place] [address]  Report Status PENDING  Incomplete  Blood culture (routine x 2)     Status: None (Preliminary result)   Collection Time: 12/11/19  8:15 PM   Specimen: BLOOD  Result Value Ref Range Status   Specimen Description BLOOD LEFT ANTECUBITAL  Final   Special Requests   Final    BOTTLES DRAWN AEROBIC AND ANAEROBIC Blood Culture results may not be optimal due to an excessive volume of blood received in culture bottles   Culture   Final    NO GROWTH < 12 HOURS Performed at [facility], [address], [place], [place] [address]    Report Status PENDING  Incomplete  Respiratory Panel by RT PCR (Flu A&B, Covid) - Nasopharyngeal Swab     Status: None   Collection Time: 12/11/19 11:24 PM   Specimen: Nasopharyngeal Swab  Result Value Ref Range Status   SARS Coronavirus 2 by RT PCR NEGATIVE NEGATIVE  Final    Comment: (NOTE) SARS-CoV-2 target nucleic acids are NOT DETECTED. The SARS-CoV-2 RNA is generally detectable in upper respiratoy specimens during the acute phase of infection. The lowest concentration of SARS-CoV-2 viral copies this assay can detect is 131 copies/mL. A negative result does not preclude SARS-Cov-2 infection and should not be used as the sole basis for treatment or other patient management decisions. A negative result may occur with  improper specimen collection/handling, submission of specimen other than nasopharyngeal swab, presence of viral mutation(s) within the areas targeted by this assay, and inadequate number of viral copies (<131 copies/mL). A negative result must be combined with clinical observations, patient history, and epidemiological information. The expected result is Negative. Fact Sheet for Patients:  PinkCheek.be Fact Sheet for Healthcare Providers:  GravelBags.it This test is not yet ap proved or cleared by the [place] FDA and  has been authorized for detection and/or diagnosis of SARS-CoV-2 by FDA under an Emergency Use Authorization (EUA). This EUA will remain  in effect (meaning this test can be used) for the duration of the COVID-19 declaration under Section 564(b)(1) of the Act, 21 U.S.C. section 360bbb-3(b)(1), unless the authorization is terminated or revoked sooner.    Influenza A by PCR NEGATIVE NEGATIVE Final   Influenza B by PCR NEGATIVE NEGATIVE Final    Comment: (NOTE) The Xpert Xpress SARS-CoV-2/FLU/RSV assay is intended as an aid in  the diagnosis of influenza from Nasopharyngeal swab specimens and  should not be used as a sole basis for treatment. Nasal washings and  aspirates are unacceptable for Xpert Xpress SARS-CoV-2/FLU/RSV  testing. Fact Sheet for Patients: PinkCheek.be Fact Sheet for Healthcare  Providers: GravelBags.it This test is not yet approved or cleared by the [place] FDA and  has been authorized for detection and/or diagnosis of SARS-CoV-2 by  FDA under an Emergency Use Authorization (EUA). This EUA will remain  in effect (meaning this test can be used) for the duration of the  Covid-19 declaration under Section 564(b)(1) of the Act, 21  U.S.C. section 360bbb-3(b)(1), unless the authorization is  terminated or revoked. Performed at [facility], [address] [place], [place] [address]     Procedures and diagnostic studies:  CT Angio Chest PE W and/or Wo Contrast  Result Date: 12/11/2019 CLINICAL DATA:  86 year old female with shortness of breath. EXAM: CT ANGIOGRAPHY CHEST WITH CONTRAST TECHNIQUE: Multidetector CT imaging of the chest was performed using the standard protocol during bolus administration of intravenous contrast. Multiplanar CT image reconstructions and MIPs were obtained to evaluate the vascular anatomy.  CONTRAST:  54mL OMNIPAQUE IOHEXOL 350 MG/ML SOLN COMPARISON:  Chest radiograph dated 06/11/2016. FINDINGS: Cardiovascular: There is mild cardiomegaly. No pericardial effusion. Advanced 3 vessel coronary vascular calcification. There is mild atherosclerotic calcification of the thoracic aorta. No aneurysmal dilatation or dissection. The right vertebral artery appears limited. The remainder of the visualized great vessels of the aortic arch appear patent. No CT evidence of pulmonary artery embolism. Mediastinum/Nodes: There is no hilar or mediastinal adenopathy. There is a moderate hiatal hernia. No mediastinal fluid collection. Lungs/Pleura: There is background of centrilobular emphysema. Bilateral patchy and streaky airspace opacity and reticular densities most consistent with multifocal pneumonia. Clinical correlation and follow-up to resolution recommended. There is a small right pleural effusion. No pneumothorax. The  central airways are patent. Upper Abdomen: A 1 cm left hepatic cyst and additional subcentimeter hypodensity PH is too small to characterize. Musculoskeletal: Osteopenia with degenerative changes of the spine and multilevel lower thoracic old appearing compression fractures. No definite acute osseous pathology. Review of the MIP images confirms the above findings. IMPRESSION: 1. No CT evidence of pulmonary artery embolism. 2. Multifocal pneumonia. Clinical correlation and follow-up to resolution recommended. 3. Small right pleural effusion. 4. Mild cardiomegaly with advanced 3 vessel coronary vascular calcification. 5. Moderate hiatal hernia. 6. Aortic Atherosclerosis (ICD10-I70.0). Electronically Signed   By: Anner Crete M.D.   On: 12/11/2019 22:37   DG Chest Portable 1 View  Result Date: 12/11/2019 CLINICAL DATA:  Shortness of breath EXAM: PORTABLE CHEST 1 VIEW COMPARISON:  Chest CT June 11, 2016 FINDINGS: There is extensive airspace opacifications throughout much of the right upper and lower lung regions. Ill-defined opacity is also noted in the left upper lobe. Heart is upper normal in size with pulmonary vascularity normal. No adenopathy. No bone lesions. IMPRESSION: Multifocal pneumonia with airspace opacity throughout much of the right lung as well as left upper lobe. Heart is upper normal in size with pulmonary vascularity normal. No adenopathy. Electronically Signed   By: Lowella Grip III M.D.   On: 12/11/2019 20:51    Medications:    enoxaparin (LOVENOX) injection  40 mg Subcutaneous Q24H   ipratropium-albuterol  3 mL Nebulization Q6H   methylPREDNISolone (SOLU-MEDROL) injection  40 mg Intravenous Q6H   Followed by   Derrill Memo ON 12/13/2019] predniSONE  40 mg Oral Q breakfast   pantoprazole  40 mg Oral BID AC   Continuous Infusions:  sodium chloride 75 mL/hr at 12/12/19 0602   azithromycin     cefTRIAXone (ROCEPHIN)  IV     vancomycin       LOS: 1 day   Radene Gunning  NP  [facility]   How to contact the [facility] Attending or Consulting provider [place] or covering provider during after hours [place], for this patient?  Check the care team in [facility] and look for a) attending/consulting TRH provider listed and b) the [facility] team listed Log into [email or web address] and use [place]'s universal password to access. If you do not have the password, please contact the hospital operator. Locate the [facility] provider you are looking for under [facility] and page to a number that you can be directly reached. If you still have difficulty reaching the provider, please page the [facility] (Director on Call) for the Hospitalists listed on amion for assistance.  12/12/2019, 1:43 PM

## 2019-12-13 LAB — CBC WITH DIFFERENTIAL/PLATELET
Abs Immature Granulocytes: 0.35 10*3/uL — ABNORMAL HIGH (ref 0.00–0.07)
Basophils Absolute: 0 10*3/uL (ref 0.0–0.1)
Basophils Relative: 0 %
Eosinophils Absolute: 0 10*3/uL (ref 0.0–0.5)
Eosinophils Relative: 0 %
HCT: 36.2 % (ref 36.0–46.0)
Hemoglobin: 12.5 g/dL (ref 12.0–15.0)
Immature Granulocytes: 2 %
Lymphocytes Relative: 6 %
Lymphs Abs: 1.2 10*3/uL (ref 0.7–4.0)
MCH: 28.9 pg (ref 26.0–34.0)
MCHC: 34.5 g/dL (ref 30.0–36.0)
MCV: 83.6 fL (ref 80.0–100.0)
Monocytes Absolute: 1.6 10*3/uL — ABNORMAL HIGH (ref 0.1–1.0)
Monocytes Relative: 7 %
Neutro Abs: 18.1 10*3/uL — ABNORMAL HIGH (ref 1.7–7.7)
Neutrophils Relative %: 85 %
Platelets: 422 10*3/uL — ABNORMAL HIGH (ref 150–400)
RBC: 4.33 MIL/uL (ref 3.87–5.11)
RDW: 12.5 % (ref 11.5–15.5)
WBC: 21.2 10*3/uL — ABNORMAL HIGH (ref 4.0–10.5)
nRBC: 0 % (ref 0.0–0.2)

## 2019-12-13 LAB — BASIC METABOLIC PANEL
Anion gap: 8 (ref 5–15)
BUN: 21 mg/dL (ref 8–23)
CO2: 25 mmol/L (ref 22–32)
Calcium: 8.8 mg/dL — ABNORMAL LOW (ref 8.9–10.3)
Chloride: 100 mmol/L (ref 98–111)
Creatinine, Ser: 0.62 mg/dL (ref 0.44–1.00)
GFR calc Af Amer: >60 mL/min (ref >60)
GFR calc non Af Amer: >60 mL/min (ref >60)
Glucose, Bld: 104 mg/dL — ABNORMAL HIGH (ref 70–99)
Potassium: 3.4 mmol/L — ABNORMAL LOW (ref 3.5–5.1)
Sodium: 133 mmol/L — ABNORMAL LOW (ref 135–145)

## 2019-12-13 LAB — PROCALCITONIN: Procalcitonin: <0.1 ng/mL

## 2019-12-13 LAB — BRAIN NATRIURETIC PEPTIDE: B Natriuretic Peptide: 387 pg/mL — ABNORMAL HIGH (ref 0.0–100.0)

## 2019-12-13 MED ORDER — IPRATROPIUM-ALBUTEROL 0.5-2.5 (3) MG/3ML IN SOLN
3.0000 mL | Freq: Three times a day (TID) | RESPIRATORY_TRACT | Status: DC
  Administered 2019-12-13 – 2019-12-18 (×13): 3 mL via RESPIRATORY_TRACT
  Filled 2019-12-13 (×13): qty 3

## 2019-12-13 MED ORDER — ALPRAZOLAM 0.25 MG PO TABS: 0.2500 mg | ORAL_TABLET | Freq: Three times a day (TID) | ORAL | Status: DC | PRN

## 2019-12-13 MED ORDER — POTASSIUM CHLORIDE CRYS ER 20 MEQ PO TBCR
40.0000 meq | EXTENDED_RELEASE_TABLET | Freq: Once | ORAL | Status: AC
  Administered 2019-12-13: 40 meq via ORAL
  Filled 2019-12-13: qty 2

## 2019-12-13 MED ORDER — FUROSEMIDE 10 MG/ML IJ SOLN
40.0000 mg | Freq: Once | INTRAMUSCULAR | Status: AC
  Administered 2019-12-13: 40 mg via INTRAVENOUS
  Filled 2019-12-13: qty 4

## 2019-12-13 NOTE — NC FL2 (Signed)
[place] LEVEL OF CARE SCREENING TOOL     IDENTIFICATION  Patient Name: Krista Parker Birthdate: 1932-05-07 Sex: female Admission Date (Current Location): 12/11/2019  [place] and [place] Number:  Engineering geologist and Address:  [facility], [address], [place], [place] [address]      Provider Number: [id number]  Attending Physician Name and Address:  Deatra James, MD  Relative Name and Phone Number:  Odia Tolliver son [phone]    Current Level of Care: Hospital Recommended Level of Care: [place] Prior Approval Number:    Date Approved/Denied:   PASRR Number: [id number]  Discharge Plan: SNF    Current Diagnoses: Patient Active Problem List   Diagnosis Date Noted  . Acute respiratory failure with hypoxia ([place]) 12/12/2019  . Aortic insufficiency   . Mitral regurgitation   . COPD with acute exacerbation ([place]) 12/11/2019  . CAP (community acquired pneumonia) 12/11/2019  . Essential hypertension 12/11/2019  . Hypoxia 12/11/2019  . Hypertensive urgency 12/11/2019  . Hyponatremia 12/11/2019  . Abnormal ECG 02/13/2014  . Murmur 02/13/2014  . History of abdominal pain   . Rib pain     Orientation RESPIRATION BLADDER Height & Weight     Self, Time, Situation, Place  O2(3 liters continuous) Continent Weight: 178 lb 2.1 oz (80.8 kg) Height:  5\' 2"  (157.5 cm)  BEHAVIORAL SYMPTOMS/MOOD NEUROLOGICAL BOWEL NUTRITION STATUS      Continent Diet(heart healthy-thin liquids)  AMBULATORY STATUS COMMUNICATION OF NEEDS Skin   Limited Assist Verbally Normal                       Personal Care Assistance Level of Assistance  Bathing, Dressing Bathing Assistance: Limited assistance   Dressing Assistance: Limited assistance     Functional Limitations Info  Sight(glasses) Sight Info: Impaired        SPECIAL CARE FACTORS FREQUENCY  PT (By licensed PT), OT (By licensed OT)      PT Frequency: 5x week OT Frequency: 5x week            Contractures Contractures Info: Not present    Additional Factors Info  Code Status, Allergies Code Status Info: DNR Allergies Info: no known allergies           Current Medications (12/13/2019):  This is the current hospital active medication list Current Facility-Administered Medications  Medication Dose Route Frequency Provider Last Rate Last Admin  . acetaminophen (TYLENOL) tablet 650 mg  650 mg Oral Q6H PRN Skipper Cliche A, MD   650 mg at 12/13/19 0139  . albuterol (PROVENTIL) (2.5 MG/3ML) 0.083% nebulizer solution 2.5 mg  2.5 mg Nebulization Q2H PRN Athena Masse, MD      . ALPRAZolam Duanne Moron) tablet 0.25 mg  0.25 mg Oral TID PRN Shahmehdi, Seyed A, MD      . alum & mag hydroxide-simeth (MAALOX/MYLANTA) 200-200-20 MG/5ML suspension 30 mL  30 mL Oral Q4H PRN Shahmehdi, Seyed A, MD   30 mL at 12/13/19 0140  . azithromycin (ZITHROMAX) 500 mg in sodium chloride 0.9 % 250 mL IVPB  500 mg Intravenous Q24H Athena Masse, MD   Stopped at 12/13/19 0030  . cefTRIAXone (ROCEPHIN) 2 g in sodium chloride 0.9 % 100 mL IVPB  2 g Intravenous Q24H Athena Masse, MD   Stopped at 12/12/19 2314  . enoxaparin (LOVENOX) injection 40 mg  40 mg Subcutaneous Q24H Athena Masse, MD   40 mg at 12/12/19  2244  . ipratropium-albuterol (DUONEB) 0.5-2.5 (3) MG/3ML nebulizer solution 3 mL  3 mL Nebulization Q6H Athena Masse, MD   3 mL at 12/13/19 0758  . labetalol (NORMODYNE) injection 10 mg  10 mg Intravenous Q4H PRN Athena Masse, MD      . lisinopril (ZESTRIL) tablet 10 mg  10 mg Oral Daily Radene Gunning, NP   10 mg at 12/13/19 0855  . melatonin tablet 5 mg  5 mg Oral QHS PRN Sharion Settler, NP      . metoprolol succinate (TOPROL-XL) 24 hr tablet 50 mg  50 mg Oral Daily Radene Gunning, NP   50 mg at 12/13/19 0854  . pantoprazole (PROTONIX) EC tablet 40 mg  40 mg Oral BID AC Shahmehdi, Seyed A, MD   40 mg at 12/13/19 0854  .  predniSONE (DELTASONE) tablet 40 mg  40 mg Oral Q breakfast Athena Masse, MD   40 mg at 12/13/19 0855  . vancomycin (VANCOREADY) IVPB 1500 mg/300 mL  1,500 mg Intravenous Once Vanessa [place], MD         Discharge Medications: Please see discharge summary for a list of discharge medications.  Relevant Imaging Results:  Relevant Lab Results:   Additional Information SSN:[id number]  Audry Riles, Gardiner Rhyme, LCSW

## 2019-12-13 NOTE — TOC Progression Note (Signed)
Transition of Care [facility]) - Progression Note    Patient Details  Name: Eymi Lipuma MRN: [id number] Date of Birth: 11/05/1931  Transition of Care [facility]) CM/SW Contact  Shyan Scalisi, Gardiner Rhyme, LCSW Phone Number: 12/13/2019, 1:36 PM  Clinical Narrative:   Met with pt to see how she is doing. She is having difficulty breathing today more so than yesterday. She feels once her lungs can get better she will do better. Discussed options of going home with son versus going to SNF. She feels her son's home is too busy for her and she would rather go to a SNF then go home. Will check with tomorrow and see how she is doing and what she would like to do. Continue to work on discharge plans.    Expected Discharge Plan: [place] Barriers to Discharge: Continued Medical Work up  Expected Discharge Plan and Services Expected Discharge Plan: [place] In-house Referral: Clinical Social Work   Post Acute Care Choice: Home Health, Durable Medical Equipment Living arrangements for the past 2 months: Single Family Home                                       Social Determinants of Health (SDOH) Interventions    Readmission Risk Interventions No flowsheet data found.

## 2019-12-13 NOTE — Progress Notes (Signed)
Progress Note    Krista Parker  [id number] DOB: Jun 28, 1932  DOA: 12/11/2019 PCP: Leonel Ramsay, MD    Chief complaint:   DOE/pneumonia  Subjective:   The patient was seen and examined this morning, in mild to moderate acute distress due to shortness of breath, anxiety. She remained awake alert, following command.  Reports of no major issues overnight.   Brief Narrative:   Medical records reviewed and are as summarized below:  Krista Parker is an 86 y.o. female with a past medical history that includes hypertension, ongoing dyspnea with exertion, recent pneumonia diagnosis on antibiotics, heart murmur, osteoporosis presented to the emergency department March 23 of complaint persistent worsening shortness of breath.  Work-up in the emergency department revealed increased oxygen demand with oxygen saturation level 90% on room air and tachypnea with movement, hypertension, tachycardia, leukocytosis and hyponatremia as well as multifocal pneumonia with airspace opacity throughout much of the right lung as well as left upper lobe.  She was provided with IV antibiotics as well as nebulizers and oxygen supplementation.  Assessment/Plan:   Principal Problem:   Acute respiratory failure with hypoxia (HCC) Active Problems:   COPD with acute exacerbation (HCC)   CAP (community acquired pneumonia)   Hypertensive urgency   Hyponatremia   Aortic insufficiency   Mitral regurgitation   Acute respiratory failure with hypoxia secondary to community-acquired pneumonia in the setting of a mild COPD exacerbation.   -Patient continues to require supplemental oxygen, 2 L to maintain O2 sat of 93%.  Patient desats with exertion ambulation -Leukocytosis in the setting of lactic acid of 1.1, procalcitonin <0.10, likely due to added steroids will taper down accordingly  -Patient is currently anxious, with shortness of breath, no overt edema, mild crackles at bases.  -On  admission:  Lactic acid within the limits of normal and procalcitonin less than 0.1.   Covid negative.  . -  Patient failed outpatient therapy with Augmentin and Levaquin.   She does not wear oxygen at home.  -Continue IV antibiotics >> azithromycin/Rocephin -Nebulizers as needed -Follow blood cultures >> no growth to date -Follow sputum culture >> no growth to date -Monitor oxygen saturation level -Supplemental oxygen as indicated to keep sats greater than 94% -Wean oxygen as able -Incentive spirometry   Hypertensive urgency.   On admission blood pressure was 183/102.   Home medications include lisinopril and Toprol. .. Were resumed, blood pressure has improved 159/68 this morning -We will continue to monitor closely   Hyponatremia.   Sodium level 128 on admission >> 133   Etiology unclear.  May be related to medications in the setting of hypovolemia, dehydration, in setting of infection -Was treated with normal saline, sodium steadily improved -Follow urine and serum osmolality -DC IV fluid normal saline now.  Dyspnea with exertion/ Aortic insufficiency/ M itral regurg.   -Chart review in care everywhere reveals patient has recently been evaluated by cardiology for persistent dyspnea on exertion.   Echo with an EF of 60% and grade 1 diastolic dysfunction.  Note also indicates nonrheumatic mitral valve regurg and afterload reduction added recommended low-sodium diet.  cardiology's note dated 2 weeks ago indicates plan to evaluate for pulmonary etiology for dyspnea with exertion with a PA and lateral.   Unclear if this x-ray was done but I could not pull up the results.  Scheduled to follow-up April 13. -Treat as above . -Incentive spirometry -We will DC IV fluids today. -Patient will receive 1 dose of IV 40  mg of Lasix - Will monitor I's and O's, daily weight   Family Communication/Anticipated D/C date and plan/Code Status   DVT prophylaxis: Lovenox ordered. Code Status:  Full Code.  Family Communication: patient Disposition Plan: home when ready   Medical Consultants:    None.   Anti-Infectives:    None  Objective:    Vitals:   12/13/19 0500 12/13/19 0600 12/13/19 0700 12/13/19 0800  BP:   (!) 159/68   Pulse:   78   Resp: 18 (!) 23    Temp:   97.9 F (36.6 C)   TempSrc:   Oral   SpO2:   94% 93%  Weight:      Height:        Intake/Output Summary (Last 24 hours) at 12/13/2019 1156 Last data filed at 12/13/2019 0900 Gross per 24 hour  Intake 2010.38 ml  Output 2000 ml  Net 10.38 ml   Filed Weights   12/11/19 2021 12/12/19 0550  Weight: 80.3 kg 80.8 kg       Data Revi Mattie he paged me andewed:   I have personally reviewed following labs and imaging studies:  Labs: Labs show the following:   Basic Metabolic Panel: Recent Labs  Lab 12/11/19 2015 12/11/19 2015 12/12/19 1417 12/13/19 0607  NA 128*  --  131* 133*  K 3.7   < > 3.5 3.4*  CL 91*  --  97* 100  CO2 25  --  22 25  GLUCOSE 110*  --  122* 104*  BUN 19  --  17 21  CREATININE 0.74  --  0.65 0.62  CALCIUM 9.7  --  9.1 8.8*   < > = values in this interval not displayed.   GFR Estimated Creatinine Clearance: 50.6 mL/min (by C-G formula based on SCr of 0.62 mg/dL). Liver Function Tests: Recent Labs  Lab 12/11/19 2015  AST 28  ALT 23  ALKPHOS 86  BILITOT 0.7  PROT 7.4  ALBUMIN 3.6   No results for input(s): LIPASE, AMYLASE in the last 168 hours. No results for input(s): AMMONIA in the last 168 hours. Coagulation profile No results for input(s): INR, PROTIME in the last 168 hours.  CBC: Recent Labs  Lab 12/11/19 2015 12/13/19 0607  WBC 16.2* 21.2*  NEUTROABS 14.0* 18.1*  HGB 14.2 12.5  HCT 41.3 36.2  MCV 84.5 83.6  PLT 499* 422*  Sepsis Labs: Recent Labs  Lab 12/11/19 2015 12/11/19 2324 12/12/19 0244 12/13/19 0607  PROCALCITON <0.10  --  <0.10 <0.10  WBC 16.2*  --   --  21.2*  LATICACIDVEN 1.7 1.1  --   --     Microbiology Recent  Results (from the past 240 hour(s))  Blood culture (routine x 2)     Status: None (Preliminary result)   Collection Time: 12/11/19  8:15 PM   Specimen: BLOOD  Result Value Ref Range Status   Specimen Description BLOOD RIGHT ANTECUBITAL  Final   Special Requests   Final    BOTTLES DRAWN AEROBIC AND ANAEROBIC Blood Culture results may not be optimal due to an excessive volume of blood received in culture bottles   Culture   Final    NO GROWTH 2 DAYS Performed at [facility], [address] [place], [place] [address]    Report Status PENDING  Incomplete  Blood culture (routine x 2)     Status: None (Preliminary result)   Collection Time: 12/11/19  8:15 PM   Specimen: BLOOD  Result  Value Ref Range Status   Specimen Description BLOOD LEFT ANTECUBITAL  Final   Special Requests   Final    BOTTLES DRAWN AEROBIC AND ANAEROBIC Blood Culture results may not be optimal due to an excessive volume of blood received in culture bottles   Culture   Final    NO GROWTH 2 DAYS Performed at [facility], [address] [place], [place] [address]    Report Status PENDING  Incomplete  Respiratory Panel by RT PCR (Flu A&B, Covid) - Nasopharyngeal Swab     Status: None   Collection Time: 12/11/19 11:24 PM   Specimen: Nasopharyngeal Swab  Result Value Ref Range Status   SARS Coronavirus 2 by RT PCR NEGATIVE NEGATIVE Final    Comment: (NOTE) SARS-CoV-2 target nucleic acids are NOT DETECTED. The SARS-CoV-2 RNA is generally detectable in upper respiratoy specimens during the acute phase of infection. The lowest concentration of SARS-CoV-2 viral copies this assay can detect is 131 copies/mL. A negative result does not preclude SARS-Cov-2 infection and should not be used as the sole basis for treatment or other patient management decisions. A negative result may occur with  improper specimen collection/handling, submission of specimen other than nasopharyngeal swab, presence of viral  mutation(s) within the areas targeted by this assay, and inadequate number of viral copies (<131 copies/mL). A negative result must be combined with clinical observations, patient history, and epidemiological information. The expected result is Negative. Fact Sheet for Patients:  PinkCheek.be Fact Sheet for Healthcare Providers:  GravelBags.it This test is not yet ap proved or cleared by the [place] FDA and  has been authorized for detection and/or diagnosis of SARS-CoV-2 by FDA under an Emergency Use Authorization (EUA). This EUA will remain  in effect (meaning this test can be used) for the duration of the COVID-19 declaration under Section 564(b)(1) of the Act, 21 U.S.C. section 360bbb-3(b)(1), unless the authorization is terminated or revoked sooner.    Influenza A by PCR NEGATIVE NEGATIVE Final   Influenza B by PCR NEGATIVE NEGATIVE Final    Comment: (NOTE) The Xpert Xpress SARS-CoV-2/FLU/RSV assay is intended as an aid in  the diagnosis of influenza from Nasopharyngeal swab specimens and  should not be used as a sole basis for treatment. Nasal washings and  aspirates are unacceptable for Xpert Xpress SARS-CoV-2/FLU/RSV  testing. Fact Sheet for Patients: PinkCheek.be Fact Sheet for Healthcare Providers: GravelBags.it This test is not yet approved or cleared by the [place] FDA and  has been authorized for detection and/or diagnosis of SARS-CoV-2 by  FDA under an Emergency Use Authorization (EUA). This EUA will remain  in effect (meaning this test can be used) for the duration of the  Covid-19 declaration under Section 564(b)(1) of the Act, 21  U.S.C. section 360bbb-3(b)(1), unless the authorization is  terminated or revoked. Performed at [facility], [address] [place], [place] [address]     Procedures and diagnostic studies:  CT  Angio Chest PE W and/or Wo Contrast  Result Date: 12/11/2019 CLINICAL DATA:  86 year old female with shortness of breath. EXAM: CT ANGIOGRAPHY CHEST WITH CONTRAST TECHNIQUE: Multidetector CT imaging of the chest was performed using the standard protocol during bolus administration of intravenous contrast. Multiplanar CT image reconstructions and MIPs were obtained to evaluate the vascular anatomy. CONTRAST:  35mL OMNIPAQUE IOHEXOL 350 MG/ML SOLN COMPARISON:  Chest radiograph dated 06/11/2016. FINDINGS: Cardiovascular: There is mild cardiomegaly. No pericardial effusion. Advanced 3 vessel coronary vascular calcification. There is mild atherosclerotic calcification  of the thoracic aorta. No aneurysmal dilatation or dissection. The right vertebral artery appears limited. The remainder of the visualized great vessels of the aortic arch appear patent. No CT evidence of pulmonary artery embolism. Mediastinum/Nodes: There is no hilar or mediastinal adenopathy. There is a moderate hiatal hernia. No mediastinal fluid collection. Lungs/Pleura: There is background of centrilobular emphysema. Bilateral patchy and streaky airspace opacity and reticular densities most consistent with multifocal pneumonia. Clinical correlation and follow-up to resolution recommended. There is a small right pleural effusion. No pneumothorax. The central airways are patent. Upper Abdomen: A 1 cm left hepatic cyst and additional subcentimeter hypodensity PH is too small to characterize. Musculoskeletal: Osteopenia with degenerative changes of the spine and multilevel lower thoracic old appearing compression fractures. No definite acute osseous pathology. Review of the MIP images confirms the above findings. IMPRESSION: 1. No CT evidence of pulmonary artery embolism. 2. Multifocal pneumonia. Clinical correlation and follow-up to resolution recommended. 3. Small right pleural effusion. 4. Mild cardiomegaly with advanced 3 vessel coronary vascular  calcification. 5. Moderate hiatal hernia. 6. Aortic Atherosclerosis (ICD10-I70.0). Electronically Signed   By: Anner Crete M.D.   On: 12/11/2019 22:37   DG Chest Portable 1 View  Result Date: 12/11/2019 CLINICAL DATA:  Shortness of breath EXAM: PORTABLE CHEST 1 VIEW COMPARISON:  Chest CT June 11, 2016 FINDINGS: There is extensive airspace opacifications throughout much of the right upper and lower lung regions. Ill-defined opacity is also noted in the left upper lobe. Heart is upper normal in size with pulmonary vascularity normal. No adenopathy. No bone lesions. IMPRESSION: Multifocal pneumonia with airspace opacity throughout much of the right lung as well as left upper lobe. Heart is upper normal in size with pulmonary vascularity normal. No adenopathy. Electronically Signed   By: Lowella Grip III M.D.   On: 12/11/2019 20:51    Medications:   . enoxaparin (LOVENOX) injection  40 mg Subcutaneous Q24H  . ipratropium-albuterol  3 mL Nebulization Q6H  . lisinopril  10 mg Oral Daily  . metoprolol succinate  50 mg Oral Daily  . pantoprazole  40 mg Oral BID AC  . predniSONE  40 mg Oral Q breakfast   Continuous Infusions: . azithromycin Stopped (12/13/19 0030)  . cefTRIAXone (ROCEPHIN)  IV Stopped (12/12/19 2314)  . vancomycin       LOS: 2 days   Deatra James MD  [facility]   How to contact the [facility] Attending or Consulting provider [place] or covering provider during after hours [place], for this patient?  1. Check the care team in [facility] and look for a) attending/consulting [facility] provider listed and b) the [facility] team listed 2. Log into [email or web address] and use 's universal password to access. If you do not have the password, please contact the hospital operator. 3. Locate the [facility] provider you are looking for under [facility] and page to a number that you can be directly reached. 4. If you still have difficulty reaching the provider, please page the [facility]  (Director on Call) for the Hospitalists listed on amion for assistance.  12/13/2019, 11:56 AM

## 2019-12-14 ENCOUNTER — Inpatient Hospital Stay: Payer: Medicare Other

## 2019-12-14 LAB — BASIC METABOLIC PANEL
Anion gap: 8 (ref 5–15)
BUN: 23 mg/dL (ref 8–23)
CO2: 26 mmol/L (ref 22–32)
Calcium: 8.9 mg/dL (ref 8.9–10.3)
Chloride: 101 mmol/L (ref 98–111)
Creatinine, Ser: 0.66 mg/dL (ref 0.44–1.00)
GFR calc Af Amer: >60 mL/min (ref >60)
GFR calc non Af Amer: >60 mL/min (ref >60)
Glucose, Bld: 97 mg/dL (ref 70–99)
Potassium: 3.5 mmol/L (ref 3.5–5.1)
Sodium: 135 mmol/L (ref 135–145)

## 2019-12-14 LAB — CBC WITH DIFFERENTIAL/PLATELET
Abs Immature Granulocytes: 0.23 10*3/uL — ABNORMAL HIGH (ref 0.00–0.07)
Basophils Absolute: 0 10*3/uL (ref 0.0–0.1)
Basophils Relative: 0 %
Eosinophils Absolute: 0.2 10*3/uL (ref 0.0–0.5)
Eosinophils Relative: 1 %
HCT: 35.5 % — ABNORMAL LOW (ref 36.0–46.0)
Hemoglobin: 12 g/dL (ref 12.0–15.0)
Immature Granulocytes: 2 %
Lymphocytes Relative: 9 %
Lymphs Abs: 1.3 10*3/uL (ref 0.7–4.0)
MCH: 28.8 pg (ref 26.0–34.0)
MCHC: 33.8 g/dL (ref 30.0–36.0)
MCV: 85.1 fL (ref 80.0–100.0)
Monocytes Absolute: 1.3 10*3/uL — ABNORMAL HIGH (ref 0.1–1.0)
Monocytes Relative: 9 %
Neutro Abs: 12.2 10*3/uL — ABNORMAL HIGH (ref 1.7–7.7)
Neutrophils Relative %: 79 %
Platelets: 429 10*3/uL — ABNORMAL HIGH (ref 150–400)
RBC: 4.17 MIL/uL (ref 3.87–5.11)
RDW: 12.6 % (ref 11.5–15.5)
WBC: 15.3 10*3/uL — ABNORMAL HIGH (ref 4.0–10.5)
nRBC: 0 % (ref 0.0–0.2)

## 2019-12-14 MED ORDER — FUROSEMIDE 10 MG/ML IJ SOLN
40.0000 mg | Freq: Once | INTRAMUSCULAR | Status: AC
  Administered 2019-12-14: 10:00:00 40 mg via INTRAVENOUS
  Filled 2019-12-14: qty 4

## 2019-12-14 MED ORDER — POTASSIUM CHLORIDE CRYS ER 20 MEQ PO TBCR
20.0000 meq | EXTENDED_RELEASE_TABLET | Freq: Once | ORAL | Status: AC
  Administered 2019-12-14: 10:00:00 20 meq via ORAL
  Filled 2019-12-14: qty 1

## 2019-12-14 NOTE — Progress Notes (Signed)
Occupational Therapy Treatment Patient Details Name: Krista Parker MRN: [id number] DOB: 1932/08/22 Today's Date: 12/14/2019    History of present illness Krista Parker is a 86 y.o. female with medical history significant for hypertension the, diagnosed with pneumonia 1 week prior who presented to the emergency room for continued shortness of breath in spite of outpatient treatment; admitted for inpatient management of multifocal PNA.  Received both Covid shots, last one on 12/02/2019 and tested negative for Covid on 12/07/2019   OT comments  Pt pleasant and agreeable to OT session. Pt O2 increased to 3L O2 today and pt reporting feeling a little more SOB this date. Pt instructed in ECS with handout provided for ADL and IADL tasks, body positioning and AE/DME to minimize SOB and over exertion, and incentive spirometer use to improve her lung function. Pt educated in strategies to promote recall and carryover with incentive spirometer use. Pt verbalized understanding and agreeable to attempt to use once per hour. Pt continues to benefit from skilled OT services. Continue to recommend SNF.   Follow Up Recommendations  SNF    Equipment Recommendations  3 in 1 bedside commode    Recommendations for Other Services      Precautions / Restrictions Precautions Precautions: Fall Restrictions Weight Bearing Restrictions: No       Mobility Bed Mobility                  Transfers                      Balance                                           ADL either performed or assessed with clinical judgement   ADL                                               Vision       Perception     Praxis      Cognition Arousal/Alertness: Awake/alert Behavior During Therapy: WFL for tasks assessed/performed Overall Cognitive Status: Within Functional Limits for tasks assessed                                           Exercises Other Exercises Other Exercises: Pt instructed in ECS with handout provided for ADL and IADL tasks, body positioning and AE/DME to minimize SOB and over exertion, and incentive spirometer use to improve her lung function   Shoulder Instructions       General Comments      Pertinent Vitals/ Pain          Home Living                                          Prior Functioning/Environment              Frequency  Min 1X/week        Progress Toward Goals  OT Goals(current goals can now be found in the care plan section)  Progress towards OT  goals: Progressing toward goals  Acute Rehab OT Goals Patient Stated Goal: "I want to catch my breath better" OT Goal Formulation: With patient Time For Goal Achievement: 12/26/19 Potential to Achieve Goals: Good  Plan Discharge plan remains appropriate;Frequency remains appropriate    Co-evaluation                 AM-PAC OT "6 Clicks" Daily Activity     Outcome Measure   Help from another person eating meals?: None Help from another person taking care of personal grooming?: None Help from another person toileting, which includes using toliet, bedpan, or urinal?: A Little Help from another person bathing (including washing, rinsing, drying)?: A Little Help from another person to put on and taking off regular upper body clothing?: None Help from another person to put on and taking off regular lower body clothing?: A Little 6 Click Score: 21    End of Session    OT Visit Diagnosis: Unsteadiness on feet (R26.81);Muscle weakness (generalized) (M62.81);History of falling (Z91.81)   Activity Tolerance Patient tolerated treatment well   Patient Left in bed;with call bell/phone within reach;with bed alarm set   Nurse Communication          Time: [id number] OT Time Calculation (min): 32 min  Charges: OT General Charges $OT Visit: 1 Visit OT Treatments $Self Care/Home  Management : 23-37 mins  Jeni Salles, MPH, MS, OTR/L ascom [phone] 12/14/19, 11:28 AM

## 2019-12-14 NOTE — Progress Notes (Signed)
The patient has been c/o right shoulder pain. She said it hurts to move it and even hurts to raise her hand up to feed herself. Messaged Dr. Roger Shelter and he ordered and x-ray of same. Later x-ray came back negative for fracture. New order for PT to work with patients right shoulder.

## 2019-12-14 NOTE — Care Management Important Message (Signed)
Important Message  Patient Details  Name: Krista Parker MRN: [id number] Date of Birth: 03-25-1932   Medicare Important Message Given:  Yes     Juliann Pulse A Ellyana Crigler 12/14/2019, 11:22 AM

## 2019-12-14 NOTE — TOC Progression Note (Signed)
Transition of Care [facility]) - Progression Note    Patient Details  Name: Krista Parker MRN: [id number] Date of Birth: 1932/06/04  [facility] of [facility]) CM/SW Contact  Shelvie Salsberry, Gardiner Rhyme, LCSW Phone Number: 12/14/2019, 3:09 PM  Clinical Narrative:   Met with pt and son who is in the room visiting, both are on board for her to go to a rehab from here and then hopefully home. Both want to pursue [place] and she will decide when ready. Her main concern is she is in a private room there. Have sent pout information for bed search at both facilities as long as others. Will need new COVID test prior to transfer. Aware this worker is not here over the weekend but will see Monday if still here.    Expected Discharge Plan: [place] Barriers to Discharge: Continued Medical Work up  Expected Discharge Plan and Services Expected Discharge Plan: [place] In-house Referral: Clinical Social Work   Post Acute Care Choice: Home Health, Durable Medical Equipment Living arrangements for the past 2 months: Single Family Home                                       Social Determinants of Health (SDOH) Interventions    Readmission Risk Interventions No flowsheet data found.

## 2019-12-14 NOTE — Progress Notes (Signed)
Progress Note    Krista Parker  [id number] DOB: 03/14/1932  DOA: 12/11/2019 PCP: Leonel Ramsay, MD    Chief complaint:   DOE/pneumonia  Subjective:   The patient was seen and examined this morning,  Continues to be in respiratory distress, satting 90% on 2 L of oxygen, anxious shortness of breath is worse with exertion.  Denies any chest pain No issues overnight  -Patient was informed that we have spoken to his son regarding his care... Including likely that he needs rehab skilled nursing facility placement upon discharge-.  He was agreeable  Brief Narrative:   Medical records reviewed and are as summarized below:  Krista Parker is an 86 y.o. female with a past medical history that includes hypertension, ongoing dyspnea with exertion, recent pneumonia diagnosis on antibiotics, heart murmur, osteoporosis presented to the emergency department March 23 of complaint persistent worsening shortness of breath.  Work-up in the emergency department revealed increased oxygen demand with oxygen saturation level 90% on room air and tachypnea with movement, hypertension, tachycardia, leukocytosis and hyponatremia as well as multifocal pneumonia with airspace opacity throughout much of the right lung as well as left upper lobe.  She was provided with IV antibiotics as well as nebulizers and oxygen supplementation.  Assessment/Plan:   Principal Problem:   Acute respiratory failure with hypoxia (HCC) Active Problems:   COPD with acute exacerbation (HCC)   CAP (community acquired pneumonia)   Hypertensive urgency   Hyponatremia   Aortic insufficiency   Mitral regurgitation   Acute respiratory failure with hypoxia secondary to community-acquired pneumonia in the setting of a mild COPD exacerbation.   -Remains in respiratory distress, satting 90% on 2 L of oxygen, worsening shortness of breath with exertion O2 demand has increased from 2 to 3 L this morning. -Remain  anxious.  -Leukocytosis in the setting of lactic acid of 1.1, procalcitonin <0.10, likely due to added steroids will taper down accordingly    -On admission:  Lactic acid within the limits of normal and procalcitonin less than 0.1.   Covid negative.  . -  Patient failed outpatient therapy with Augmentin and Levaquin.   She does not wear oxygen at home.... Now she requiring up to 3 L of oxygen to maintain O2 sat greater than 90%  -Continue IV antibiotics >> azithromycin/Rocephin -Nebulizers as needed -Follow blood cultures >> no growth to date -Follow sputum culture >> no growth to date -Monitor oxygen saturation level -Supplemental oxygen as indicated to keep sats greater than 94% -Wean oxygen as able -Incentive spirometry   Hypertensive urgency.   On admission blood pressure was 183/102.   Home medications include lisinopril and Toprol. .. Were resumed, blood pressure has improved 159/68 this morning -We will continue to monitor closely   Hyponatremia.   Sodium level 128 on admission >> 133, 135   Etiology unclear.  May be related to medications in the setting of hypovolemia, dehydration, in setting of infection -Was treated with normal saline, sodium steadily improved -Follow urine and serum osmolality -DC IV fluid normal saline now.  Dyspnea with exertion/ Aortic insufficiency/ M itral regurg.   -Chart review in care everywhere reveals patient has recently been evaluated by cardiology for persistent dyspnea on exertion.   Echo with an EF of 60% and grade 1 diastolic dysfunction.  Note also indicates nonrheumatic mitral valve regurg and afterload reduction added recommended low-sodium diet.  cardiology's note dated 2 weeks ago indicates plan to evaluate for pulmonary etiology for dyspnea with  exertion with a PA and lateral.   Unclear if this x-ray was done but I could not pull up the results.  Scheduled to follow-up April 13. -Treat as above . -Incentive spirometry -We will  DC IV fluids today. -Patient will receive 1 dose of IV 40 mg of Lasix, another dose of Lasix will be given today - likely patient will be scheduled on a small dose of diuretic upon discharge. - Will monitor I's and O's, daily weight   Family Communication/Anticipated D/C date and plan/Code Status   DVT prophylaxis: Lovenox ordered. Code Status: Full Code.  Family Communication: patient Disposition Plan: home when ready   Medical Consultants:    None.   Anti-Infectives:    None  Objective:    Vitals:   12/13/19 2025 12/14/19 0045 12/14/19 0801 12/14/19 0843  BP:  (!) 171/81  (!) 158/80  Pulse:  83  98  Resp:  18  20  Temp:  98 F (36.7 C)  (!) 97.5 F (36.4 C)  TempSrc:  Oral  Oral  SpO2: 92% 93% 90% 90%  Weight:      Height:        Intake/Output Summary (Last 24 hours) at 12/14/2019 1253 Last data filed at 12/14/2019 1226 Gross per 24 hour  Intake --  Output 1300 ml  Net -1300 ml   Filed Weights   12/11/19 2021 12/12/19 0550  Weight: 80.3 kg 80.8 kg      Physical Exam  Constitution:  Alert, cooperative, no distress, anxious complain of shortness of breath Psychiatric: Normal and stable mood and affect, cognition intact,   HEENT: Normocephalic, PERRL, otherwise with in Normal limits  Chest:Chest symmetric Cardio vascular:  S1/S2, RRR, No murmure, No Rubs or Gallops  pulmonary: Clear to auscultation bilaterally, respirations unlabored, negative wheezes / crackles Abdomen: Soft, non-tender, non-distended, bowel sounds,no masses, no organomegaly Muscular skeletal: Limited exam - in bed, able to move all 4 extremities, Normal strength,  Neuro: CNII-XII intact. , normal motor and sensation, reflexes intact  Extremities: No pitting edema lower extremities, +2 pulses  Skin: Dry, warm to touch, negative for any Rashes, No open wounds Wounds: per nursing documentation, none visible        Data Revi Mattie he paged me andewed:   I have personally  reviewed following labs and imaging studies:  Labs: Labs show the following:   Basic Metabolic Panel: Recent Labs  Lab 12/11/19 2015 12/11/19 2015 12/12/19 1417 12/12/19 1417 12/13/19 0607 12/14/19 0405  NA 128*  --  131*  --  133* 135  K 3.7   < > 3.5   < > 3.4* 3.5  CL 91*  --  97*  --  100 101  CO2 25  --  22  --  25 26  GLUCOSE 110*  --  122*  --  104* 97  BUN 19  --  17  --  21 23  CREATININE 0.74  --  0.65  --  0.62 0.66  CALCIUM 9.7  --  9.1  --  8.8* 8.9   < > = values in this interval not displayed.   GFR Estimated Creatinine Clearance: 50.6 mL/min (by C-G formula based on SCr of 0.66 mg/dL). Liver Function Tests: Recent Labs  Lab 12/11/19 2015  AST 28  ALT 23  ALKPHOS 86  BILITOT 0.7  PROT 7.4  ALBUMIN 3.6   No results for input(s): LIPASE, AMYLASE in the last 168 hours. No results for input(s): AMMONIA in the  last 168 hours. Coagulation profile No results for input(s): INR, PROTIME in the last 168 hours.  CBC: Recent Labs  Lab 12/11/19 2015 12/13/19 0607 12/14/19 0405  WBC 16.2* 21.2* 15.3*  NEUTROABS 14.0* 18.1* 12.2*  HGB 14.2 12.5 12.0  HCT 41.3 36.2 35.5*  MCV 84.5 83.6 85.1  PLT 499* 422* 429*  Sepsis Labs: Recent Labs  Lab 12/11/19 2015 12/11/19 2324 12/12/19 0244 12/13/19 0607 12/14/19 0405  PROCALCITON <0.10  --  <0.10 <0.10  --   WBC 16.2*  --   --  21.2* 15.3*  LATICACIDVEN 1.7 1.1  --   --   --     Microbiology Recent Results (from the past 240 hour(s))  Blood culture (routine x 2)     Status: None (Preliminary result)   Collection Time: 12/11/19  8:15 PM   Specimen: BLOOD  Result Value Ref Range Status   Specimen Description BLOOD RIGHT ANTECUBITAL  Final   Special Requests   Final    BOTTLES DRAWN AEROBIC AND ANAEROBIC Blood Culture results may not be optimal due to an excessive volume of blood received in culture bottles   Culture   Final    NO GROWTH 3 DAYS Performed at [facility], [address], [place], [place] [address]    Report Status PENDING  Incomplete  Blood culture (routine x 2)     Status: None (Preliminary result)   Collection Time: 12/11/19  8:15 PM   Specimen: BLOOD  Result Value Ref Range Status   Specimen Description BLOOD LEFT ANTECUBITAL  Final   Special Requests   Final    BOTTLES DRAWN AEROBIC AND ANAEROBIC Blood Culture results may not be optimal due to an excessive volume of blood received in culture bottles   Culture   Final    NO GROWTH 3 DAYS Performed at [facility], [address], [place], [place] [address]    Report Status PENDING  Incomplete  Respiratory Panel by RT PCR (Flu A&B, Covid) - Nasopharyngeal Swab     Status: None   Collection Time: 12/11/19 11:24 PM   Specimen: Nasopharyngeal Swab  Result Value Ref Range Status   SARS Coronavirus 2 by RT PCR NEGATIVE NEGATIVE Final    Comment: (NOTE) SARS-CoV-2 target nucleic acids are NOT DETECTED. The SARS-CoV-2 RNA is generally detectable in upper respiratoy specimens during the acute phase of infection. The lowest concentration of SARS-CoV-2 viral copies this assay can detect is 131 copies/mL. A negative result does not preclude SARS-Cov-2 infection and should not be used as the sole basis for treatment or other patient management decisions. A negative result may occur with  improper specimen collection/handling, submission of specimen other than nasopharyngeal swab, presence of viral mutation(s) within the areas targeted by this assay, and inadequate number of viral copies (<131 copies/mL). A negative result must be combined with clinical observations, patient history, and epidemiological information. The expected result is Negative. Fact Sheet for Patients:  PinkCheek.be Fact Sheet for Healthcare Providers:  GravelBags.it This test is not yet ap proved or cleared by the [place] FDA and  has been authorized for  detection and/or diagnosis of SARS-CoV-2 by FDA under an Emergency Use Authorization (EUA). This EUA will remain  in effect (meaning this test can be used) for the duration of the COVID-19 declaration under Section 564(b)(1) of the Act, 21 U.S.C. section 360bbb-3(b)(1), unless the authorization is terminated or revoked sooner.    Influenza A by PCR NEGATIVE NEGATIVE Final  Influenza B by PCR NEGATIVE NEGATIVE Final    Comment: (NOTE) The Xpert Xpress SARS-CoV-2/FLU/RSV assay is intended as an aid in  the diagnosis of influenza from Nasopharyngeal swab specimens and  should not be used as a sole basis for treatment. Nasal washings and  aspirates are unacceptable for Xpert Xpress SARS-CoV-2/FLU/RSV  testing. Fact Sheet for Patients: PinkCheek.be Fact Sheet for Healthcare Providers: GravelBags.it This test is not yet approved or cleared by the [place] FDA and  has been authorized for detection and/or diagnosis of SARS-CoV-2 by  FDA under an Emergency Use Authorization (EUA). This EUA will remain  in effect (meaning this test can be used) for the duration of the  Covid-19 declaration under Section 564(b)(1) of the Act, 21  U.S.C. section 360bbb-3(b)(1), unless the authorization is  terminated or revoked. Performed at [facility], [address] [place], [place] [address]     Procedures and diagnostic studies:  No results found.  Medications:   . enoxaparin (LOVENOX) injection  40 mg Subcutaneous Q24H  . ipratropium-albuterol  3 mL Nebulization TID  . lisinopril  10 mg Oral Daily  . metoprolol succinate  50 mg Oral Daily  . pantoprazole  40 mg Oral BID AC  . predniSONE  40 mg Oral Q breakfast   Continuous Infusions: . azithromycin 500 mg (12/13/19 1838)  . cefTRIAXone (ROCEPHIN)  IV 2 g (12/13/19 1704)  . vancomycin       LOS: 3 days   Deatra James MD  [facility]   How to  contact the [facility] Attending or Consulting provider [place] or covering provider during after hours [place], for this patient?  1. Check the care team in [facility] and look for a) attending/consulting [facility] provider listed and b) the [facility] team listed 2. Log into [email or web address] and use [facility]'s universal password to access. If you do not have the password, please contact the hospital operator. 3. Locate the [facility] provider you are looking for under [facility] and page to a number that you can be directly reached. 4. If you still have difficulty reaching the provider, please page the [facility] (Director on Call) for the Hospitalists listed on amion for assistance.  12/14/2019, 12:53 PM

## 2019-12-15 DIAGNOSIS — E871 Hypo-osmolality and hyponatremia: Secondary | ICD-10-CM

## 2019-12-15 LAB — CBC WITH DIFFERENTIAL/PLATELET
Abs Immature Granulocytes: 0.27 10*3/uL — ABNORMAL HIGH (ref 0.00–0.07)
Basophils Absolute: 0.1 10*3/uL (ref 0.0–0.1)
Basophils Relative: 0 %
Eosinophils Absolute: 0.5 10*3/uL (ref 0.0–0.5)
Eosinophils Relative: 4 %
HCT: 37.9 % (ref 36.0–46.0)
Hemoglobin: 12.9 g/dL (ref 12.0–15.0)
Immature Granulocytes: 2 %
Lymphocytes Relative: 11 %
Lymphs Abs: 1.4 10*3/uL (ref 0.7–4.0)
MCH: 28.7 pg (ref 26.0–34.0)
MCHC: 34 g/dL (ref 30.0–36.0)
MCV: 84.2 fL (ref 80.0–100.0)
Monocytes Absolute: 1.1 10*3/uL — ABNORMAL HIGH (ref 0.1–1.0)
Monocytes Relative: 9 %
Neutro Abs: 9.5 10*3/uL — ABNORMAL HIGH (ref 1.7–7.7)
Neutrophils Relative %: 74 %
Platelets: 431 10*3/uL — ABNORMAL HIGH (ref 150–400)
RBC: 4.5 MIL/uL (ref 3.87–5.11)
RDW: 12.7 % (ref 11.5–15.5)
WBC: 12.7 10*3/uL — ABNORMAL HIGH (ref 4.0–10.5)
nRBC: 0 % (ref 0.0–0.2)

## 2019-12-15 LAB — BASIC METABOLIC PANEL
Anion gap: 10 (ref 5–15)
BUN: 22 mg/dL (ref 8–23)
CO2: 27 mmol/L (ref 22–32)
Calcium: 8.9 mg/dL (ref 8.9–10.3)
Chloride: 97 mmol/L — ABNORMAL LOW (ref 98–111)
Creatinine, Ser: 0.72 mg/dL (ref 0.44–1.00)
GFR calc Af Amer: >60 mL/min (ref >60)
GFR calc non Af Amer: >60 mL/min (ref >60)
Glucose, Bld: 86 mg/dL (ref 70–99)
Potassium: 3.4 mmol/L — ABNORMAL LOW (ref 3.5–5.1)
Sodium: 134 mmol/L — ABNORMAL LOW (ref 135–145)

## 2019-12-15 MED ORDER — POTASSIUM CHLORIDE CRYS ER 20 MEQ PO TBCR
40.0000 meq | EXTENDED_RELEASE_TABLET | Freq: Once | ORAL | Status: AC
  Administered 2019-12-15: 14:00:00 40 meq via ORAL
  Filled 2019-12-15: qty 2

## 2019-12-15 NOTE — Progress Notes (Signed)
PROGRESS NOTE    Krista Parker  [id number] DOB: 06-Nov-1931 DOA: 12/11/2019  PCP: Leonel Ramsay, MD    LOS - 4   Brief Narrative:  Krista Parker is an 86 y.o. female with a past medical history that includes hypertension, ongoing dyspnea with exertion, recent pneumonia diagnosis on antibiotics, heart murmur, osteoporosis presented to the emergency department March 23 with complaint persistent and  worsening shortness of breath.  Work-up in the emergency department revealed increased oxygen demand with oxygen saturation level 90% on room air and tachypnea with movement, hypertension, tachycardia, leukocytosis and hyponatremia as well as multifocal pneumonia with airspace opacity throughout much of the right lung as well as left upper lobe.  She was provided with IV antibiotics as well as nebulizers and oxygen supplementation.   Subjective 3/27: Patient seen and examined at bedside this morning.  No acute events reported overnight.  She reports today that she still feels short of breath.  Expresses much concern over her right shoulder weakness.  States she has to use her opposite arm to lift her right arm up, and states very hard to feed herself.  She denies fevers or chills.  She asked if she will be getting more Lasix today and hopes she will not, states "wears her out".  Assessment & Plan:   Principal Problem:   Acute respiratory failure with hypoxia (HCC) Active Problems:   COPD with acute exacerbation (HCC)   CAP (community acquired pneumonia)   Hypertensive urgency   Hyponatremia   Aortic insufficiency   Mitral regurgitation   Aute respiratory failure with hypoxia secondary to  Community-acquired pneumonia in the setting of a mild COPD exacerbation Patient failed outpatient therapy with Augmentin and Levaquin.   Does not require supplemental oxygen at baseline. Continues to have significant conversational and exertional dyspnea, has been on 3 L/min oxygen,  was initially requiring 2 L/min.   --Continue IV antibiotics >> azithromycin/Rocephin --DuoNebs 3 times daily --Albuterol as needed --Follow blood cultures >> no growth to date --Follow sputum culture >> no growth to date --Supplemental oxygen as indicated to keep sats greater than 94%, wean as able --Incentive spirometry  Leukocytosis in the setting of lactic acid of 1.1, procalcitonin <0.10, likely due to added steroids will taper down accordingly --monitor CBC  Hypertensive urgency -present on admission with BP 183/102 Home medications include lisinopril and Toprol.  --Continue home medications --As needed IV labetalol --Monitor BP closely  Hyponatremia -present on admission, sodium level 128 on admission >> 133, 135, 134.  Improved with IV hydration.   Etiology unclear.  May be related to medications in the setting of hypovolemia, dehydration, in setting of infection.  Hypotonic serum, hypertonic urine. --HCTZ was stopped --now off IV fluids  --monitor BMP  Essential Hypertension -  --HCTZ d/c'd due to hyponatremia --continue lisinopril, metoprolol  Dyspnea with exertion/ Aortic insufficiency/ Mitral regurg.   Chart review in care everywhere reveals patient has recently been evaluated by cardiology for persistent dyspnea on exertion.   Echo with an EF of 60% and grade 1 diastolic dysfunction.  Note also indicates nonrheumatic mitral valve regurg and afterload reduction added recommended low-sodium diet.   Cardiology's note dated 2 weeks ago indicates plan to evaluate for pulmonary etiology for dyspnea with exertion with a PA and lateral.  Unclear if this x-ray was done, not able pull up the results. --keep Scheduled cardiology follow-up April 13th --Management as above --Incentive spirometry --Patient will receive 1 dose of IV 40 mg of Lasix, 2nd  dose of Lasix on 3/26 --likely patient may need to be a small dose of diuretic upon discharge --Strict I's and O's, daily weight     DVT prophylaxis: DNR   Code Status: DNR  Family Communication: none at bedside during encounter, will attempt to call  Disposition Plan: Discharge to SNF likely on Monday 3/29 pending further clinical improvement  Coming From home, lives alone in independent senior apartment Exp DC Date 3/29 Barriers SNF placement and clinical improvement Medically Stable for Discharge?  No  Consultants:   None  Procedures:   None  Antimicrobials:   Rocephin, azithromycin 3/23 >>   Objective: Vitals:   12/14/19 1603 12/14/19 1949 12/15/19 0024 12/15/19 0723  BP: 118/78  (!) 147/75   Pulse: 92  83   Resp: 20  16   Temp: 97.6 F (36.4 C)  98.3 F (36.8 C)   TempSrc: Oral  Oral   SpO2: 94% 92% 96% 94%  Weight:      Height:        Intake/Output Summary (Last 24 hours) at 12/15/2019 0743 Last data filed at 12/15/2019 [id number] Gross per 24 hour  Intake -  Output 2400 ml  Net -2400 ml   Filed Weights   12/11/19 2021 12/12/19 0550  Weight: 80.3 kg 80.8 kg    Examination:  General exam: awake, alert, no acute distress HEENT: atraumatic, clear conjunctiva, anicteric sclera, moist mucus membranes, hearing grossly normal  Respiratory system: Minimal rhonchi heard at the right base, otherwise clear, conversational dyspnea, no wheezes or crackles.  On 3 L/min nasal cannula oxygen Cardiovascular system: normal [id number], RRR, 2/6 systolic murmur, no pedal edema.   Gastrointestinal system: soft, NT, ND, no HSM felt, +bowel sounds. Central nervous system: A&O x4. no gross focal neurologic deficits, normal speech Extremities: moves all, no cyanosis, normal tone Skin: dry, intact, normal temperature, normal color Psychiatry: normal mood, congruent affect, judgement and insight appear normal    Data Reviewed: I have personally reviewed following labs and imaging studies  CBC: Recent Labs  Lab 12/11/19 2015 12/13/19 0607 12/14/19 0405 12/15/19 0635  WBC 16.2* 21.2* 15.3* 12.7*   NEUTROABS 14.0* 18.1* 12.2* 9.5*  HGB 14.2 12.5 12.0 12.9  HCT 41.3 36.2 35.5* 37.9  MCV 84.5 83.6 85.1 84.2  PLT 499* 422* 429* [id number]*   Basic Metabolic Panel: Recent Labs  Lab 12/11/19 2015 12/12/19 1417 12/13/19 0607 12/14/19 0405 12/15/19 0635  NA 128* 131* 133* 135 134*  K 3.7 3.5 3.4* 3.5 3.4*  CL 91* 97* 100 101 97*  CO2 25 22 25 26 27   GLUCOSE 110* 122* 104* 97 86  BUN 19 17 21 23 22   CREATININE 0.74 0.65 0.62 0.66 0.72  CALCIUM 9.7 9.1 8.8* 8.9 8.9   GFR: Estimated Creatinine Clearance: 50.6 mL/min (by C-G formula based on SCr of 0.72 mg/dL). Liver Function Tests: Recent Labs  Lab 12/11/19 2015  AST 28  ALT 23  ALKPHOS 86  BILITOT 0.7  PROT 7.4  ALBUMIN 3.6   No results for input(s): LIPASE, AMYLASE in the last 168 hours. No results for input(s): AMMONIA in the last 168 hours. Coagulation Profile: No results for input(s): INR, PROTIME in the last 168 hours. Cardiac Enzymes: No results for input(s): CKTOTAL, CKMB, CKMBINDEX, TROPONINI in the last 168 hours. BNP (last 3 results) No results for input(s): PROBNP in the last 8760 hours. HbA1C: No results for input(s): HGBA1C in the last 72 hours. CBG: No results for input(s): GLUCAP in the last  168 hours. Lipid Profile: No results for input(s): CHOL, HDL, LDLCALC, TRIG, CHOLHDL, LDLDIRECT in the last 72 hours. Thyroid Function Tests: No results for input(s): TSH, T4TOTAL, FREET4, T3FREE, THYROIDAB in the last 72 hours. Anemia Panel: No results for input(s): VITAMINB12, FOLATE, FERRITIN, TIBC, IRON, RETICCTPCT in the last 72 hours. Sepsis Labs: Recent Labs  Lab 12/11/19 2015 12/11/19 2324 12/12/19 0244 12/13/19 0607  PROCALCITON <0.10  --  <0.10 <0.10  LATICACIDVEN 1.7 1.1  --   --     Recent Results (from the past 240 hour(s))  Blood culture (routine x 2)     Status: None (Preliminary result)   Collection Time: 12/11/19  8:15 PM   Specimen: BLOOD  Result Value Ref Range Status   Specimen  Description BLOOD RIGHT ANTECUBITAL  Final   Special Requests   Final    BOTTLES DRAWN AEROBIC AND ANAEROBIC Blood Culture results may not be optimal due to an excessive volume of blood received in culture bottles   Culture   Final    NO GROWTH 4 DAYS Performed at [facility], [address], [place], [place] [address]    Report Status PENDING  Incomplete  Blood culture (routine x 2)     Status: None (Preliminary result)   Collection Time: 12/11/19  8:15 PM   Specimen: BLOOD  Result Value Ref Range Status   Specimen Description BLOOD LEFT ANTECUBITAL  Final   Special Requests   Final    BOTTLES DRAWN AEROBIC AND ANAEROBIC Blood Culture results may not be optimal due to an excessive volume of blood received in culture bottles   Culture   Final    NO GROWTH 4 DAYS Performed at [facility], [address], [place], [place] [address]    Report Status PENDING  Incomplete  Respiratory Panel by RT PCR (Flu A&B, Covid) - Nasopharyngeal Swab     Status: None   Collection Time: 12/11/19 11:24 PM   Specimen: Nasopharyngeal Swab  Result Value Ref Range Status   SARS Coronavirus 2 by RT PCR NEGATIVE NEGATIVE Final    Comment: (NOTE) SARS-CoV-2 target nucleic acids are NOT DETECTED. The SARS-CoV-2 RNA is generally detectable in upper respiratoy specimens during the acute phase of infection. The lowest concentration of SARS-CoV-2 viral copies this assay can detect is 131 copies/mL. A negative result does not preclude SARS-Cov-2 infection and should not be used as the sole basis for treatment or other patient management decisions. A negative result may occur with  improper specimen collection/handling, submission of specimen other than nasopharyngeal swab, presence of viral mutation(s) within the areas targeted by this assay, and inadequate number of viral copies (<131 copies/mL). A negative result must be combined with clinical observations, patient history, and  epidemiological information. The expected result is Negative. Fact Sheet for Patients:  PinkCheek.be Fact Sheet for Healthcare Providers:  GravelBags.it This test is not yet ap proved or cleared by the [place] FDA and  has been authorized for detection and/or diagnosis of SARS-CoV-2 by FDA under an Emergency Use Authorization (EUA). This EUA will remain  in effect (meaning this test can be used) for the duration of the COVID-19 declaration under Section 564(b)(1) of the Act, 21 U.S.C. section 360bbb-3(b)(1), unless the authorization is terminated or revoked sooner.    Influenza A by PCR NEGATIVE NEGATIVE Final   Influenza B by PCR NEGATIVE NEGATIVE Final    Comment: (NOTE) The Xpert Xpress SARS-CoV-2/FLU/RSV assay is intended as an aid in  the  diagnosis of influenza from Nasopharyngeal swab specimens and  should not be used as a sole basis for treatment. Nasal washings and  aspirates are unacceptable for Xpert Xpress SARS-CoV-2/FLU/RSV  testing. Fact Sheet for Patients: PinkCheek.be Fact Sheet for Healthcare Providers: GravelBags.it This test is not yet approved or cleared by the [place] FDA and  has been authorized for detection and/or diagnosis of SARS-CoV-2 by  FDA under an Emergency Use Authorization (EUA). This EUA will remain  in effect (meaning this test can be used) for the duration of the  Covid-19 declaration under Section 564(b)(1) of the Act, 21  U.S.C. section 360bbb-3(b)(1), unless the authorization is  terminated or revoked. Performed at [facility], [address], [place], [place] [address]          Radiology Studies: DG Shoulder Right  Result Date: 12/14/2019 CLINICAL DATA:  Chronic right shoulder pain. EXAM: RIGHT SHOULDER - 2+ VIEW COMPARISON:  None. FINDINGS: There is no evidence of fracture or dislocation. There is  no evidence of arthropathy or other focal bone abnormality. Soft tissues are unremarkable. IMPRESSION: Negative. Electronically Signed   By: Marijo Conception M.D.   On: 12/14/2019 15:40        Scheduled Meds: . enoxaparin (LOVENOX) injection  40 mg Subcutaneous Q24H  . ipratropium-albuterol  3 mL Nebulization TID  . lisinopril  10 mg Oral Daily  . metoprolol succinate  50 mg Oral Daily  . pantoprazole  40 mg Oral BID AC  . predniSONE  40 mg Oral Q breakfast   Continuous Infusions: . azithromycin 500 mg (12/14/19 1842)  . cefTRIAXone (ROCEPHIN)  IV 2 g (12/14/19 1741)  . vancomycin       LOS: 4 days    Time spent: 35 minutes    Ezekiel Slocumb, DO [facility]   If 7PM-7AM, please contact night-coverage [email or web address] 12/15/2019, 7:43 AM

## 2019-12-15 NOTE — Progress Notes (Signed)
Occupational Therapy Treatment Patient Details Name: Krista Parker MRN: [id number] DOB: Sep 07, 1932 Today's Date: 12/15/2019    History of present illness Krista Parker is a 86 y.o. female with medical history significant for hypertension the, diagnosed with pneumonia 1 week prior who presented to the emergency room for continued shortness of breath in spite of outpatient treatment; admitted for inpatient management of multifocal PNA.  Received both Covid shots, last one on 12/02/2019 and tested negative for Covid on 12/07/2019   OT comments  Patient seen this AM for OT treatment and agreeable to session.  Patient on 3L supplemental 02 and remained >92% for duration of session.  Patient presenting with increased R shoulder pain s/p fall.  Per patient, she was told ~5 years ago she had bursitis and pt further states it is not uncommon for her to feel more pain in her R shoulder.  Participated in B UE stretching HEP to reduce pain and stiffness and improve ability to participate in self care tasks.  Patient performed simple grooming tasks standing at sink with MIN A this date.  Noted with decreased activity tolerance and strength this session during functional transfer.  Patient verbalizes wanting to improve strength but refusing to sit in recliner or participate in further OOB activities.  Provided education on importance of sitting up and participating fully to make gains and discharge.  Patient verbalized understanding but continued to refuse sitting in recliner.  Based on today's performance, follow up recommendation remains appropriate.     Follow Up Recommendations  SNF    Equipment Recommendations  3 in 1 bedside commode    Recommendations for Other Services      Precautions / Restrictions Precautions Precautions: Fall Restrictions Weight Bearing Restrictions: No Other Position/Activity Restrictions: Noted recent R shoulder pain.       Mobility Bed Mobility Overal bed  mobility: Needs Assistance Bed Mobility: Supine to Sit;Sit to Supine     Supine to sit: Supervision;HOB elevated Sit to supine: Min guard;HOB elevated      Transfers Overall transfer level: Needs assistance Equipment used: Rolling walker (2 wheeled) Transfers: Sit to/from Stand Sit to Stand: Min assist         General transfer comment: Demonstrated decreased strength and activity tolerance    Balance                                           ADL either performed or assessed with clinical judgement   ADL Overall ADL's : Needs assistance/impaired     Grooming: Wash/dry hands;Wash/dry face;Oral care;Brushing hair;Standing;Min guard;Minimal assistance Grooming Details (indicate cue type and reason): Demonstrated decreased activity tolerance and strength                                     Vision Patient Visual Report: No change from baseline     Perception     Praxis      Cognition Arousal/Alertness: Awake/alert Behavior During Therapy: WFL for tasks assessed/performed Overall Cognitive Status: Within Functional Limits for tasks assessed                                          Exercises Other Exercises Other Exercises: Provided instruction on seated  UB stretching HEP to reduce pain and stiffness in R UE Other Exercises: Provided education on importance of participating in OOB tasks (sitting in recliner, use of BSC, etc) to improve strength and activity tolerance Other Exercises: Completed grooming standing at sink with MIN A and extra time   Shoulder Instructions       General Comments Noted increased pain in R shoulder (pt states this is not new).  Patient verbalizes she wants to regain her strength and participate but required maximal encouragement to participate in OOB tasks or sit in recliner    Pertinent Vitals/ Pain       Pain Assessment: 0-10 Pain Score: 2  Pain Location: Pain noted in R shoulder with  movement Pain Descriptors / Indicators: Tightness;Discomfort;Grimacing Pain Intervention(s): Limited activity within patient's tolerance;Monitored during session  Home Living                                          Prior Functioning/Environment              Frequency  Min 1X/week        Progress Toward Goals  OT Goals(current goals can now be found in the care plan section)  Progress towards OT goals: OT to reassess next treatment     Plan Discharge plan remains appropriate;Frequency remains appropriate    Co-evaluation                 AM-PAC OT "6 Clicks" Daily Activity     Outcome Measure                    End of Session Equipment Utilized During Treatment: Rolling walker;Gait belt  OT Visit Diagnosis: Unsteadiness on feet (R26.81);Muscle weakness (generalized) (M62.81);History of falling (Z91.81)   Activity Tolerance Patient tolerated treatment well;Other (comment)(Patient poorly motivated to participate in OOB activities)   Patient Left in bed;with call bell/phone within reach;with bed alarm set   Nurse Communication          Time: 1113-1140 OT Time Calculation (min): 27 min  Charges: OT General Charges $OT Visit: 1 Visit OT Treatments $Self Care/Home Management : 8-22 mins $Therapeutic Activity: 8-22 mins $Therapeutic Exercise: 8-22 mins  Baldomero Lamy, MS, OTR/L 12/15/19, 11:58 AM

## 2019-12-15 NOTE — TOC Progression Note (Signed)
Transition of Care [facility]) - Progression Note    Patient Details  Name: Awanda Molitoris MRN: [id number] Date of Birth: 03/04/1932  Transition of Care [facility] CM/SW Contact  Boris Sharper, LCSW Phone [phone] 12/15/2019, 1:11 PM  Clinical Narrative:    CSW reached out to pt and her son to discuss discharge plans. Son called [facility] to see if they would accept her since it was pending in the hub. [facility] agreed to accept her and pt will be discharging there for rehab. Pt will discharge on Monday 3/29. CSW notified agency of  Discharge date.   TOC will continue to follow for discharge planning needs.    Expected Discharge Plan: [place] Barriers to Discharge: Continued Medical Work up  Expected Discharge Plan and Services Expected Discharge Plan: [place] In-house Referral: Clinical Social Work   Post Acute Care Choice: Home Health, Durable Medical Equipment Living arrangements for the past 2 months: Single Family Home                                       Social Determinants of Health (SDOH) Interventions    Readmission Risk Interventions No flowsheet data found.

## 2019-12-15 NOTE — Progress Notes (Signed)
Physical Therapy Treatment Patient Details Name: Krista Parker MRN: [id number] DOB: [birth year] Today's Date: 12/15/2019    History of Present Illness Krista Parker is a 86 y.o. female with medical history significant for hypertension the, diagnosed with pneumonia 1 week prior who presented to the emergency room for continued shortness of breath in spite of outpatient treatment; admitted for inpatient management of multifocal PNA.  Received both Covid shots, last one on 12/02/2019 and tested negative for Covid on 12/07/2019    PT Comments    Pt was long sitting in bed upon arriving. She was on 3 L o2 and agreeable to PT session. Pt was able to maintain O2 > 94% throughout. Pt did not c/o shoulder pain until returned to room after ambulation. Reports having bursitis in past and states " it nothing new." She was able to exit R side of bed without physical assistance. Supervision required to stand and ambulate 160 ft in hallway with RW without LOB or unsteadiness. HR stable and pt shows no signs of fatigue. She requested to return to bed upon re-entering her room. Increased time + supervision to return to supine. Therapist discussed DC recommendation to DC home with HHPt however pt does not feel she can safely manage I'ly at home. Pt requested to go to SNF. Acute PT will continue to follow per POC and progress as able per pt tolerance.      Follow Up Recommendations  Home health PT     Equipment Recommendations  Rolling walker with 5" wheels;3in1 (PT)    Recommendations for Other Services       Precautions / Restrictions Precautions Precautions: Fall Restrictions Weight Bearing Restrictions: No Other Position/Activity Restrictions: Noted recent R shoulder pain.    Mobility  Bed Mobility Overal bed mobility: Modified Independent Bed Mobility: Supine to Sit;Sit to Supine     Supine to sit: Modified independent (Device/Increase time);HOB elevated Sit to supine: HOB  elevated;Supervision   General bed mobility comments: Increased time to perform however no physical assistance required. Vcs throughout for improved technique and sequencing.  Transfers Overall transfer level: Needs assistance Equipment used: Rolling walker (2 wheeled) Transfers: Sit to/from Stand Sit to Stand: Supervision         General transfer comment: pt was able to STS from EOB with supervision. Vcs for handplacement   Ambulation/Gait Ambulation/Gait assistance: Supervision Gait Distance (Feet): 160 Feet Assistive device: Rolling walker (2 wheeled) Gait Pattern/deviations: WFL(Within Functional Limits) Gait velocity: WNL   General Gait Details: pt was able to ambulate one lap in hallway ~ 160 ft with RW + 3 L o2. sao2 > 94% throughout.   Stairs             Wheelchair Mobility    Modified Rankin (Stroke Patients Only)       Balance                                            Cognition Arousal/Alertness: Awake/alert Behavior During Therapy: WFL for tasks assessed/performed Overall Cognitive Status: Within Functional Limits for tasks assessed                                 General Comments: A&O x 4, cooperative and pleasant      Exercises Other Exercises Other Exercises: Provided instruction on seated UB stretching HEP  to reduce pain and stiffness in R UE Other Exercises: Provided education on importance of participating in OOB tasks (sitting in recliner, use of BSC, etc) to improve strength and activity tolerance Other Exercises: Completed grooming standing at sink with MIN A and extra time    General Comments General comments (skin integrity, edema, etc.): Noted increased pain in R shoulder (pt states this is not new).  Patient verbalizes she wants to regain her strength and participate but required maximal encouragement to participate in OOB tasks or sit in recliner      Pertinent Vitals/Pain Pain Assessment:  No/denies pain Pain Score: 0-No pain Pain Location: Pain noted in R shoulder with movement Pain Descriptors / Indicators: Tightness;Discomfort;Grimacing Pain Intervention(s): Limited activity within patient's tolerance;Monitored during session    Home Living                      Prior Function            PT Goals (current goals can now be found in the care plan section) Acute Rehab PT Goals Patient Stated Goal: " I want to move without help" Progress towards PT goals: Progressing toward goals    Frequency    Min 2X/week      PT Plan Current plan remains appropriate    Co-evaluation              AM-PAC PT "6 Clicks" Mobility   Outcome Measure  Help needed turning from your back to your side while in a flat bed without using bedrails?: None Help needed moving from lying on your back to sitting on the side of a flat bed without using bedrails?: None Help needed moving to and from a bed to a chair (including a wheelchair)?: None Help needed standing up from a chair using your arms (e.g., wheelchair or bedside chair)?: A Little Help needed to walk in hospital room?: A Little Help needed climbing 3-5 steps with a railing? : A Little 6 Click Score: 21    End of Session Equipment Utilized During Treatment: Gait belt;Oxygen Activity Tolerance: Patient tolerated treatment well Patient left: in bed;with call bell/phone within reach;with bed alarm set Nurse Communication: Mobility status PT Visit Diagnosis: Muscle weakness (generalized) (M62.81);Difficulty in walking, not elsewhere classified (R26.2)     Time: [id number] PT Time Calculation (min) (ACUTE ONLY): 17 min  Charges:  $Gait Training: 8-22 mins                     Julaine Fusi PTA 12/15/19, 12:42 PM

## 2019-12-16 DIAGNOSIS — J441 Chronic obstructive pulmonary disease with (acute) exacerbation: Secondary | ICD-10-CM

## 2019-12-16 LAB — CULTURE, BLOOD (ROUTINE X 2)
Culture: NO GROWTH
Culture: NO GROWTH

## 2019-12-16 LAB — CBC WITH DIFFERENTIAL/PLATELET
Abs Immature Granulocytes: 0.32 10*3/uL — ABNORMAL HIGH (ref 0.00–0.07)
Basophils Absolute: 0.1 10*3/uL (ref 0.0–0.1)
Basophils Relative: 0 %
Eosinophils Absolute: 0.6 10*3/uL — ABNORMAL HIGH (ref 0.0–0.5)
Eosinophils Relative: 4 %
HCT: 36.4 % (ref 36.0–46.0)
Hemoglobin: 11.9 g/dL — ABNORMAL LOW (ref 12.0–15.0)
Immature Granulocytes: 2 %
Lymphocytes Relative: 9 %
Lymphs Abs: 1.4 10*3/uL (ref 0.7–4.0)
MCH: 28.2 pg (ref 26.0–34.0)
MCHC: 32.7 g/dL (ref 30.0–36.0)
MCV: 86.3 fL (ref 80.0–100.0)
Monocytes Absolute: 1.3 10*3/uL — ABNORMAL HIGH (ref 0.1–1.0)
Monocytes Relative: 9 %
Neutro Abs: 11.2 10*3/uL — ABNORMAL HIGH (ref 1.7–7.7)
Neutrophils Relative %: 76 %
Platelets: 425 10*3/uL — ABNORMAL HIGH (ref 150–400)
RBC: 4.22 MIL/uL (ref 3.87–5.11)
RDW: 12.6 % (ref 11.5–15.5)
WBC: 14.9 10*3/uL — ABNORMAL HIGH (ref 4.0–10.5)
nRBC: 0 % (ref 0.0–0.2)

## 2019-12-16 LAB — BASIC METABOLIC PANEL
Anion gap: 5 (ref 5–15)
BUN: 18 mg/dL (ref 8–23)
CO2: 29 mmol/L (ref 22–32)
Calcium: 8.4 mg/dL — ABNORMAL LOW (ref 8.9–10.3)
Chloride: 99 mmol/L (ref 98–111)
Creatinine, Ser: 0.61 mg/dL (ref 0.44–1.00)
GFR calc Af Amer: >60 mL/min (ref >60)
GFR calc non Af Amer: >60 mL/min (ref >60)
Glucose, Bld: 96 mg/dL (ref 70–99)
Potassium: 3.5 mmol/L (ref 3.5–5.1)
Sodium: 133 mmol/L — ABNORMAL LOW (ref 135–145)

## 2019-12-16 LAB — MAGNESIUM: Magnesium: 2.1 mg/dL (ref 1.7–2.4)

## 2019-12-16 MED ORDER — CLOTRIMAZOLE 1 % VA CREA
1.0000 | TOPICAL_CREAM | Freq: Every day | VAGINAL | Status: DC
  Administered 2019-12-16: 22:00:00 1 via VAGINAL
  Filled 2019-12-16: qty 45

## 2019-12-16 NOTE — Progress Notes (Addendum)
PROGRESS NOTE    Krista Parker  [id number] DOB: 1932-02-24 DOA: 12/11/2019  PCP: Leonel Ramsay, MD    LOS - 5   Brief Narrative:  Krista Parker is an 86 y.o. female with a past medical history that includes hypertension, ongoing dyspnea with exertion, recent pneumonia diagnosis on antibiotics, heart murmur, osteoporosis presented to the emergency department March 23 with complaint persistent and  worsening shortness of breath.  Work-up in the emergency department revealed increased oxygen demand with oxygen saturation level 90% on room air and tachypnea with movement, hypertension, tachycardia, leukocytosis and hyponatremia as well as multifocal pneumonia with airspace opacity throughout much of the right lung as well as left upper lobe.  She was provided with IV antibiotics as well as nebulizers and oxygen supplementation.   Subjective 3/27: Patient seen and examined at bedside this morning.  Son present at bedside.  No acute events reported overnight.  She reports still feeling "just awful".  Reports some perineal irritation, she says might be from [place] or from antibiotics, wonders if they caused a yeast infection.  Denies fevers and chills, chest pain nausea vomiting diarrhea or other complaints.  Assessment & Plan:   Principal Problem:   Acute respiratory failure with hypoxia (HCC) Active Problems:   COPD with acute exacerbation (HCC)   CAP (community acquired pneumonia)   Hypertensive urgency   Hyponatremia   Aortic insufficiency   Mitral regurgitation   Aute respiratory failure with hypoxia secondary to  Community-acquired pneumonia in the setting of a mild COPD exacerbation Patient failed outpatient therapy with Augmentin and Levaquin.   Does not require supplemental oxygen at baseline.   Continues to have significant conversational and exertional dyspnea, has been on 3 L/min oxygen, was initially requiring 2 L/min.   --Continue  zithromycin/Rocephin, can complete course with PO Augmentin --DuoNebs 3 times daily --Albuterol as needed --Blood cultures negative, final.  Sputum cultures appears not to have been done. --Supplemental oxygen as indicated to keep sats greater than 94%, wean as able --Incentive spirometry  Leukocytosis in the setting of lactic acid of 1.1, procalcitonin <0.10, likely due to added steroids will taper down accordingly --monitor CBC  Hypertensive urgency -present on admission with BP 183/102 Home medications include lisinopril and Toprol.  --Continue home medications --As needed IV labetalol --Monitor BP closely  Hyponatremia -present on admission, sodium level 128 on admission >> 133, 135, 134.  Improved with IV hydration.   Etiology unclear.  May be related to medications in the setting of hypovolemia, dehydration, in setting of infection.  Hypotonic serum, hypertonic urine. --HCTZ was stopped --now off IV fluids  --monitor BMP  Essential Hypertension -  --HCTZ d/c'd due to hyponatremia --continue lisinopril, metoprolol  Dyspnea with exertion/ Aortic insufficiency/ Mitral regurg.   Chart review in care everywhere reveals patient has recently been evaluated by cardiology for persistent dyspnea on exertion.   Echo with an EF of 60% and grade 1 diastolic dysfunction.  Note also indicates nonrheumatic mitral valve regurg and afterload reduction added recommended low-sodium diet.   Cardiology's note dated 2 weeks ago indicates plan to evaluate for pulmonary etiology for dyspnea with exertion with a PA and lateral.  Unclear if this x-ray was done, not able pull up the results. --keep Scheduled cardiology follow-up April 13th --Management as above --Incentive spirometry --Patient given Lasix twice without much improvement.   --likely patient may need to be a small dose of diuretic upon discharge vs PRN use --Strict I's and O's, daily weight  Right Shoulder Pain with Weakness - suspicious  for rotator cuff tear/strain.  Patient reports a long history of issues with her shoulder, but at home is able to use it and is active.  She now cannot perform shoulder flexion, has to use opposite arm to lift the right arm.  She did have a fall/near-fall in her shower at home shortly before admission.   Vaginal irritation - possible yeast infection from antibiotics --antifungal cream qHS x 7 days   DVT prophylaxis: DNR   Code Status: DNR  Family Communication: son Romie Minus at bedside during encounter, updated and all questions answered.  Disposition Plan: Discharge to SNF on Monday 3/29 if bed ready.  Coming From home, lives alone in independent senior apartment Exp DC Date 3/29 Barriers SNF placement and clinical improvement Medically Stable for Discharge?  No  Consultants:   None  Procedures:   None  Antimicrobials:   Rocephin, azithromycin 3/23 >>   Objective: Vitals:   12/16/19 0737 12/16/19 0827 12/16/19 0830 12/16/19 1407  BP:  (!) 153/72    Pulse:  84    Resp:  18    Temp:      TempSrc:      SpO2: 92% (!) 86% 92% 95%  Weight:      Height:        Intake/Output Summary (Last 24 hours) at 12/16/2019 1525 Last data filed at 12/16/2019 1455 Gross per 24 hour  Intake 480 ml  Output 2200 ml  Net -1720 ml   Filed Weights   12/11/19 2021 12/12/19 0550  Weight: 80.3 kg 80.8 kg    Examination:  General exam: awake, alert, no acute distress Respiratory system: right base crackles, otherwise clear, less conversational dyspnea today, no wheezes or crackles.  On 3 L/min nasal cannula oxygen. Cardiovascular system: normal [id number], RRR, 2/6 systolic murmur, no pedal edema. Central nervous system: A&O x4. no gross focal neurologic deficits, normal speech Extremities: moves all, no cyanosis, normal tone Skin: dry, intact, normal temperature, normal color Psychiatry: normal mood, congruent affect, judgement and insight appear normal    Data Reviewed: I have personally  reviewed following labs and imaging studies  CBC: Recent Labs  Lab 12/11/19 2015 12/13/19 0607 12/14/19 0405 12/15/19 0635 12/16/19 0527  WBC 16.2* 21.2* 15.3* 12.7* 14.9*  NEUTROABS 14.0* 18.1* 12.2* 9.5* 11.2*  HGB 14.2 12.5 12.0 12.9 11.9*  HCT 41.3 36.2 35.5* 37.9 36.4  MCV 84.5 83.6 85.1 84.2 86.3  PLT 499* 422* 429* 431* [id number]*   Basic Metabolic Panel: Recent Labs  Lab 12/12/19 1417 12/13/19 0607 12/14/19 0405 12/15/19 0635 12/16/19 0527  NA 131* 133* 135 134* 133*  K 3.5 3.4* 3.5 3.4* 3.5  CL 97* 100 101 97* 99  CO2 22 25 26 27 29   GLUCOSE 122* 104* 97 86 96  BUN 17 21 23 22 18   CREATININE 0.65 0.62 0.66 0.72 0.61  CALCIUM 9.1 8.8* 8.9 8.9 8.4*  MG  --   --   --   --  2.1   GFR: Estimated Creatinine Clearance: 50.6 mL/min (by C-G formula based on SCr of 0.61 mg/dL). Liver Function Tests: Recent Labs  Lab 12/11/19 2015  AST 28  ALT 23  ALKPHOS 86  BILITOT 0.7  PROT 7.4  ALBUMIN 3.6   No results for input(s): LIPASE, AMYLASE in the last 168 hours. No results for input(s): AMMONIA in the last 168 hours. Coagulation Profile: No results for input(s): INR, PROTIME in the last 168 hours. Cardiac Enzymes:  No results for input(s): CKTOTAL, CKMB, CKMBINDEX, TROPONINI in the last 168 hours. BNP (last 3 results) No results for input(s): PROBNP in the last 8760 hours. HbA1C: No results for input(s): HGBA1C in the last 72 hours. CBG: No results for input(s): GLUCAP in the last 168 hours. Lipid Profile: No results for input(s): CHOL, HDL, LDLCALC, TRIG, CHOLHDL, LDLDIRECT in the last 72 hours. Thyroid Function Tests: No results for input(s): TSH, T4TOTAL, FREET4, T3FREE, THYROIDAB in the last 72 hours. Anemia Panel: No results for input(s): VITAMINB12, FOLATE, FERRITIN, TIBC, IRON, RETICCTPCT in the last 72 hours. Sepsis Labs: Recent Labs  Lab 12/11/19 2015 12/11/19 2324 12/12/19 0244 12/13/19 0607  PROCALCITON <0.10  --  <0.10 <0.10  LATICACIDVEN 1.7  1.1  --   --     Recent Results (from the past 240 hour(s))  Blood culture (routine x 2)     Status: None   Collection Time: 12/11/19  8:15 PM   Specimen: BLOOD  Result Value Ref Range Status   Specimen Description BLOOD RIGHT ANTECUBITAL  Final   Special Requests   Final    BOTTLES DRAWN AEROBIC AND ANAEROBIC Blood Culture results may not be optimal due to an excessive volume of blood received in culture bottles   Culture   Final    NO GROWTH 5 DAYS Performed at [facility], [address] [place], [place] [address]    Report Status 12/16/2019 FINAL  Final  Blood culture (routine x 2)     Status: None   Collection Time: 12/11/19  8:15 PM   Specimen: BLOOD  Result Value Ref Range Status   Specimen Description BLOOD LEFT ANTECUBITAL  Final   Special Requests   Final    BOTTLES DRAWN AEROBIC AND ANAEROBIC Blood Culture results may not be optimal due to an excessive volume of blood received in culture bottles   Culture   Final    NO GROWTH 5 DAYS Performed at [facility], [address] [place], [place] [address]    Report Status 12/16/2019 FINAL  Final  Respiratory Panel by RT PCR (Flu A&B, Covid) - Nasopharyngeal Swab     Status: None   Collection Time: 12/11/19 11:24 PM   Specimen: Nasopharyngeal Swab  Result Value Ref Range Status   SARS Coronavirus 2 by RT PCR NEGATIVE NEGATIVE Final    Comment: (NOTE) SARS-CoV-2 target nucleic acids are NOT DETECTED. The SARS-CoV-2 RNA is generally detectable in upper respiratoy specimens during the acute phase of infection. The lowest concentration of SARS-CoV-2 viral copies this assay can detect is 131 copies/mL. A negative result does not preclude SARS-Cov-2 infection and should not be used as the sole basis for treatment or other patient management decisions. A negative result may occur with  improper specimen collection/handling, submission of specimen other than nasopharyngeal swab, presence of viral  mutation(s) within the areas targeted by this assay, and inadequate number of viral copies (<131 copies/mL). A negative result must be combined with clinical observations, patient history, and epidemiological information. The expected result is Negative. Fact Sheet for Patients:  PinkCheek.be Fact Sheet for Healthcare Providers:  GravelBags.it This test is not yet ap proved or cleared by the [place] FDA and  has been authorized for detection and/or diagnosis of SARS-CoV-2 by FDA under an Emergency Use Authorization (EUA). This EUA will remain  in effect (meaning this test can be used) for the duration of the COVID-19 declaration under Section 564(b)(1) of the Act, 21 U.S.C. section 360bbb-3(b)(1),  unless the authorization is terminated or revoked sooner.    Influenza A by PCR NEGATIVE NEGATIVE Final   Influenza B by PCR NEGATIVE NEGATIVE Final    Comment: (NOTE) The Xpert Xpress SARS-CoV-2/FLU/RSV assay is intended as an aid in  the diagnosis of influenza from Nasopharyngeal swab specimens and  should not be used as a sole basis for treatment. Nasal washings and  aspirates are unacceptable for Xpert Xpress SARS-CoV-2/FLU/RSV  testing. Fact Sheet for Patients: PinkCheek.be Fact Sheet for Healthcare Providers: GravelBags.it This test is not yet approved or cleared by the [place] FDA and  has been authorized for detection and/or diagnosis of SARS-CoV-2 by  FDA under an Emergency Use Authorization (EUA). This EUA will remain  in effect (meaning this test can be used) for the duration of the  Covid-19 declaration under Section 564(b)(1) of the Act, 21  U.S.C. section 360bbb-3(b)(1), unless the authorization is  terminated or revoked. Performed at [facility], [address] [place], [place] [address]          Radiology Studies: DG Shoulder  Right  Result Date: 12/14/2019 CLINICAL DATA:  Chronic right shoulder pain. EXAM: RIGHT SHOULDER - 2+ VIEW COMPARISON:  None. FINDINGS: There is no evidence of fracture or dislocation. There is no evidence of arthropathy or other focal bone abnormality. Soft tissues are unremarkable. IMPRESSION: Negative. Electronically Signed   By: Marijo Conception M.D.   On: 12/14/2019 15:40        Scheduled Meds: . clotrimazole  1 Applicatorful Vaginal QHS  . enoxaparin (LOVENOX) injection  40 mg Subcutaneous Q24H  . ipratropium-albuterol  3 mL Nebulization TID  . lisinopril  10 mg Oral Daily  . metoprolol succinate  50 mg Oral Daily  . pantoprazole  40 mg Oral BID AC   Continuous Infusions: . cefTRIAXone (ROCEPHIN)  IV 200 mL/hr at 12/15/19 1833     LOS: 5 days    Time spent: 30 minutes    Ezekiel Slocumb, DO [facility]   If 7PM-7AM, please contact night-coverage [email or web address] 12/16/2019, 3:25 PM

## 2019-12-17 LAB — BASIC METABOLIC PANEL
Anion gap: 10 (ref 5–15)
BUN: 14 mg/dL (ref 8–23)
CO2: 27 mmol/L (ref 22–32)
Calcium: 8.7 mg/dL — ABNORMAL LOW (ref 8.9–10.3)
Chloride: 96 mmol/L — ABNORMAL LOW (ref 98–111)
Creatinine, Ser: 0.65 mg/dL (ref 0.44–1.00)
GFR calc Af Amer: >60 mL/min (ref >60)
GFR calc non Af Amer: >60 mL/min (ref >60)
Glucose, Bld: 79 mg/dL (ref 70–99)
Potassium: 3.6 mmol/L (ref 3.5–5.1)
Sodium: 133 mmol/L — ABNORMAL LOW (ref 135–145)

## 2019-12-17 LAB — CBC WITH DIFFERENTIAL/PLATELET
Abs Immature Granulocytes: 0.32 10*3/uL — ABNORMAL HIGH (ref 0.00–0.07)
Basophils Absolute: 0.1 10*3/uL (ref 0.0–0.1)
Basophils Relative: 1 %
Eosinophils Absolute: 0.5 10*3/uL (ref 0.0–0.5)
Eosinophils Relative: 4 %
HCT: 38.8 % (ref 36.0–46.0)
Hemoglobin: 12.9 g/dL (ref 12.0–15.0)
Immature Granulocytes: 3 %
Lymphocytes Relative: 11 %
Lymphs Abs: 1.4 10*3/uL (ref 0.7–4.0)
MCH: 28.7 pg (ref 26.0–34.0)
MCHC: 33.2 g/dL (ref 30.0–36.0)
MCV: 86.4 fL (ref 80.0–100.0)
Monocytes Absolute: 1.1 10*3/uL — ABNORMAL HIGH (ref 0.1–1.0)
Monocytes Relative: 9 %
Neutro Abs: 8.8 10*3/uL — ABNORMAL HIGH (ref 1.7–7.7)
Neutrophils Relative %: 72 %
Platelets: 457 10*3/uL — ABNORMAL HIGH (ref 150–400)
RBC: 4.49 MIL/uL (ref 3.87–5.11)
RDW: 12.6 % (ref 11.5–15.5)
WBC: 12 10*3/uL — ABNORMAL HIGH (ref 4.0–10.5)
nRBC: 0 % (ref 0.0–0.2)

## 2019-12-17 LAB — SARS CORONAVIRUS 2 (TAT 6-24 HRS): SARS Coronavirus 2: NEGATIVE

## 2019-12-17 MED ORDER — ALUM & MAG HYDROXIDE-SIMETH 200-200-20 MG/5ML PO SUSP: 30.0000 mL | ORAL | 0 refills | Status: DC | PRN

## 2019-12-17 MED ORDER — ALPRAZOLAM 0.25 MG PO TABS: 0.2500 mg | ORAL_TABLET | Freq: Three times a day (TID) | ORAL | 0 refills | Status: DC | PRN

## 2019-12-17 MED ORDER — CLOTRIMAZOLE 1 % VA CREA: 1.0000 | TOPICAL_CREAM | Freq: Every day | VAGINAL | 0 refills | Status: DC

## 2019-12-17 MED ORDER — FUROSEMIDE 40 MG PO TABS: 40.0000 mg | ORAL_TABLET | Freq: Every day | ORAL | 11 refills | Status: DC | PRN

## 2019-12-17 MED ORDER — ALBUTEROL SULFATE HFA 108 (90 BASE) MCG/ACT IN AERS: 2.0000 | INHALATION_SPRAY | Freq: Four times a day (QID) | RESPIRATORY_TRACT | Status: AC | PRN

## 2019-12-17 MED ORDER — MELATONIN 5 MG PO TABS: 5.0000 mg | ORAL_TABLET | Freq: Every evening | ORAL | 0 refills | Status: DC | PRN

## 2019-12-17 MED ORDER — LISINOPRIL 10 MG PO TABS: 10.0000 mg | ORAL_TABLET | Freq: Every day | ORAL | Status: DC

## 2019-12-17 NOTE — Progress Notes (Signed)
PROGRESS NOTE    Krista Parker  [id number] DOB: Aug 06, 1932 DOA: 12/11/2019  PCP: Leonel Ramsay, MD    LOS - 6   Brief Narrative:  Krista Parker is an 86 y.o. female with a past medical history that includes hypertension, ongoing dyspnea with exertion, recent pneumonia diagnosis on antibiotics, heart murmur, osteoporosis presented to the emergency department March 23 with complaint persistent and  worsening shortness of breath.  Work-up in the emergency department revealed increased oxygen demand with oxygen saturation level 90% on room air and tachypnea with movement, hypertension, tachycardia, leukocytosis and hyponatremia as well as multifocal pneumonia with airspace opacity throughout much of the right lung as well as left upper lobe.  She was provided with IV antibiotics as well as nebulizers and oxygen supplementation.   Subjective 3/27: Patient seen and examined at bedside this morning.  Daughter present at bedside.  No acute events reported overnight.  She reports feeling anxious about "everything going on today".  Otherwise states she feels well.  Vaginal irritation improved with first dose of cream yesterday.  Denies fevers and chills, chest pain nausea vomiting diarrhea or other complaints.  Assessment & Plan:   Principal Problem:   Acute respiratory failure with hypoxia (HCC) Active Problems:   COPD with acute exacerbation (HCC)   CAP (community acquired pneumonia)   Hypertensive urgency   Hyponatremia   Aortic insufficiency   Mitral regurgitation   Aute respiratory failure with hypoxia secondary to  Community-acquired pneumonia in the setting of a mild COPD exacerbation Patient failed outpatient therapy with Augmentin and Levaquin.   Does not require supplemental oxygen at baseline.  Continues to require supplemental oxygen at 2-3 L/min --Last day of Rocephin and Zithromax --DuoNebs 3 times daily --Albuterol as needed --Blood cultures negative,  final.  Sputum cultures appears not to have been done. --Supplemental oxygen as indicated to keep sats greater than 94%, wean as able --Incentive spirometry  Leukocytosis in the setting of lactic acid of 1.1, procalcitonin <0.10, likely due to added steroids will taper down accordingly --monitor CBC  Hypertensive urgency -present on admission with BP 183/102 Home medications include lisinopril and Toprol.  --Continue home medications --As needed IV labetalol --Monitor BP closely  Hyponatremia -present on admission, sodium level 128 on admission >> 133, 135, 134.  Improved with IV hydration.   Etiology unclear.  May be related to medications in the setting of hypovolemia, dehydration, in setting of infection.  Hypotonic serum, hypertonic urine. --HCTZ was stopped --now off IV fluids  --monitor BMP  Essential Hypertension -  --HCTZ d/c'd due to hyponatremia --continue lisinopril, metoprolol  Dyspnea with exertion/ Aortic insufficiency/ Mitral regurg.   Chart review in care everywhere reveals patient has recently been evaluated by cardiology for persistent dyspnea on exertion.   Echo with an EF of 60% and grade 1 diastolic dysfunction.  Note also indicates nonrheumatic mitral valve regurg and afterload reduction added recommended low-sodium diet.   Cardiology's note dated 2 weeks ago indicates plan to evaluate for pulmonary etiology for dyspnea with exertion with a PA and lateral.  Unclear if this x-ray was done, not able pull up the results. --keep Scheduled cardiology follow-up April 13th --Management as above --Incentive spirometry --Patient given Lasix twice without much improvement.   --likely patient may need to be a small dose of diuretic upon discharge vs PRN use --Strict I's and O's, daily weight  Right Shoulder Pain with Weakness - suspicious for rotator cuff tear/strain.  Patient reports a long history of  issues with her shoulder, but at home is able to use it and is active.   She now cannot perform shoulder flexion, has to use opposite arm to lift the right arm.  She did have a fall/near-fall in her shower at home shortly before admission.   Vaginal irritation - possible yeast infection from antibiotics --antifungal cream qHS x 7 days   DVT prophylaxis: DNR   Code Status: DNR  Family Communication: son Romie Minus at bedside during encounter, updated and all questions answered.  Disposition Plan: Discharge to [facility] in the morning.  Patient was to go today, however result of Covid test delayed and facility unable to admit patient later in the day as they would not be able to obtain her oxygen or medications. Coming From home, lives alone in independent senior apartment Exp DC Date 3/29 Barriers awaiting COVID-19 screen result Medically Stable for Discharge?  Yes  Consultants:   None  Procedures:   None  Antimicrobials:   Rocephin, azithromycin 3/23 >>   Objective: Vitals:   12/17/19 0844 12/17/19 1417 12/17/19 1501 12/17/19 1538  BP:   (!) 149/64 (!) 123/45  Pulse:  79 79 89  Resp:  18 17 20   Temp:   97.9 F (36.6 C) 97.8 F (36.6 C)  TempSrc:   Oral Oral  SpO2: 95% 92% 99% 96%  Weight:      Height:        Intake/Output Summary (Last 24 hours) at 12/17/2019 1734 Last data filed at 12/17/2019 0957 Gross per 24 hour  Intake 480 ml  Output 600 ml  Net -120 ml   Filed Weights   12/11/19 2021 12/12/19 0550  Weight: 80.3 kg 80.8 kg    Examination:  General exam: awake, alert, no acute distress Respiratory system: Diminished at the bases otherwise clear bilaterally, no wheezes or crackles.  On 3 L/min nasal cannula oxygen. Cardiovascular system: normal [id number], RRR, 2/6 systolic murmur, no pedal edema. Central nervous system: A&O x4. no gross focal neurologic deficits, normal speech Extremities: moves all, no cyanosis, normal tone Skin: dry, intact, normal temperature, normal color Psychiatry: normal mood, congruent affect,  judgement and insight appear normal    Data Reviewed: I have personally reviewed following labs and imaging studies  CBC: Recent Labs  Lab 12/13/19 0607 12/14/19 0405 12/15/19 0635 12/16/19 0527 12/17/19 0542  WBC 21.2* 15.3* 12.7* 14.9* 12.0*  NEUTROABS 18.1* 12.2* 9.5* 11.2* 8.8*  HGB 12.5 12.0 12.9 11.9* 12.9  HCT 36.2 35.5* 37.9 36.4 38.8  MCV 83.6 85.1 84.2 86.3 86.4  PLT 422* 429* 431* 425* [id number]*   Basic Metabolic Panel: Recent Labs  Lab 12/13/19 0607 12/14/19 0405 12/15/19 0635 12/16/19 0527 12/17/19 0542  NA 133* 135 134* 133* 133*  K 3.4* 3.5 3.4* 3.5 3.6  CL 100 101 97* 99 96*  CO2 25 26 27 29 27   GLUCOSE 104* 97 86 96 79  BUN 21 23 22 18 14   CREATININE 0.62 0.66 0.72 0.61 0.65  CALCIUM 8.8* 8.9 8.9 8.4* 8.7*  MG  --   --   --  2.1  --    GFR: Estimated Creatinine Clearance: 50.6 mL/min (by C-G formula based on SCr of 0.65 mg/dL). Liver Function Tests: Recent Labs  Lab 12/11/19 2015  AST 28  ALT 23  ALKPHOS 86  BILITOT 0.7  PROT 7.4  ALBUMIN 3.6   No results for input(s): LIPASE, AMYLASE in the last 168 hours. No results for input(s): AMMONIA in the last  168 hours. Coagulation Profile: No results for input(s): INR, PROTIME in the last 168 hours. Cardiac Enzymes: No results for input(s): CKTOTAL, CKMB, CKMBINDEX, TROPONINI in the last 168 hours. BNP (last 3 results) No results for input(s): PROBNP in the last 8760 hours. HbA1C: No results for input(s): HGBA1C in the last 72 hours. CBG: No results for input(s): GLUCAP in the last 168 hours. Lipid Profile: No results for input(s): CHOL, HDL, LDLCALC, TRIG, CHOLHDL, LDLDIRECT in the last 72 hours. Thyroid Function Tests: No results for input(s): TSH, T4TOTAL, FREET4, T3FREE, THYROIDAB in the last 72 hours. Anemia Panel: No results for input(s): VITAMINB12, FOLATE, FERRITIN, TIBC, IRON, RETICCTPCT in the last 72 hours. Sepsis Labs: Recent Labs  Lab 12/11/19 2015 12/11/19 2324  12/12/19 0244 12/13/19 0607  PROCALCITON <0.10  --  <0.10 <0.10  LATICACIDVEN 1.7 1.1  --   --     Recent Results (from the past 240 hour(s))  Blood culture (routine x 2)     Status: None   Collection Time: 12/11/19  8:15 PM   Specimen: BLOOD  Result Value Ref Range Status   Specimen Description BLOOD RIGHT ANTECUBITAL  Final   Special Requests   Final    BOTTLES DRAWN AEROBIC AND ANAEROBIC Blood Culture results may not be optimal due to an excessive volume of blood received in culture bottles   Culture   Final    NO GROWTH 5 DAYS Performed at [facility], [address] [place], [place] [address]    Report Status 12/16/2019 FINAL  Final  Blood culture (routine x 2)     Status: None   Collection Time: 12/11/19  8:15 PM   Specimen: BLOOD  Result Value Ref Range Status   Specimen Description BLOOD LEFT ANTECUBITAL  Final   Special Requests   Final    BOTTLES DRAWN AEROBIC AND ANAEROBIC Blood Culture results may not be optimal due to an excessive volume of blood received in culture bottles   Culture   Final    NO GROWTH 5 DAYS Performed at [facility], [address] [place], [place] [address]    Report Status 12/16/2019 FINAL  Final  Respiratory Panel by RT PCR (Flu A&B, Covid) - Nasopharyngeal Swab     Status: None   Collection Time: 12/11/19 11:24 PM   Specimen: Nasopharyngeal Swab  Result Value Ref Range Status   SARS Coronavirus 2 by RT PCR NEGATIVE NEGATIVE Final    Comment: (NOTE) SARS-CoV-2 target nucleic acids are NOT DETECTED. The SARS-CoV-2 RNA is generally detectable in upper respiratoy specimens during the acute phase of infection. The lowest concentration of SARS-CoV-2 viral copies this assay can detect is 131 copies/mL. A negative result does not preclude SARS-Cov-2 infection and should not be used as the sole basis for treatment or other patient management decisions. A negative result may occur with  improper specimen  collection/handling, submission of specimen other than nasopharyngeal swab, presence of viral mutation(s) within the areas targeted by this assay, and inadequate number of viral copies (<131 copies/mL). A negative result must be combined with clinical observations, patient history, and epidemiological information. The expected result is Negative. Fact Sheet for Patients:  PinkCheek.be Fact Sheet for Healthcare Providers:  GravelBags.it This test is not yet ap proved or cleared by the [place] FDA and  has been authorized for detection and/or diagnosis of SARS-CoV-2 by FDA under an Emergency Use Authorization (EUA). This EUA will remain  in effect (meaning this test can be used)  for the duration of the COVID-19 declaration under Section 564(b)(1) of the Act, 21 U.S.C. section 360bbb-3(b)(1), unless the authorization is terminated or revoked sooner.    Influenza A by PCR NEGATIVE NEGATIVE Final   Influenza B by PCR NEGATIVE NEGATIVE Final    Comment: (NOTE) The Xpert Xpress SARS-CoV-2/FLU/RSV assay is intended as an aid in  the diagnosis of influenza from Nasopharyngeal swab specimens and  should not be used as a sole basis for treatment. Nasal washings and  aspirates are unacceptable for Xpert Xpress SARS-CoV-2/FLU/RSV  testing. Fact Sheet for Patients: PinkCheek.be Fact Sheet for Healthcare Providers: GravelBags.it This test is not yet approved or cleared by the [place] FDA and  has been authorized for detection and/or diagnosis of SARS-CoV-2 by  FDA under an Emergency Use Authorization (EUA). This EUA will remain  in effect (meaning this test can be used) for the duration of the  Covid-19 declaration under Section 564(b)(1) of the Act, 21  U.S.C. section 360bbb-3(b)(1), unless the authorization is  terminated or revoked. Performed at [facility],  [place], [place] [address]   SARS CORONAVIRUS 2 (TAT 6-24 HRS) Nasopharyngeal Nasopharyngeal Swab     Status: None   Collection Time: 12/17/19  9:59 AM   Specimen: Nasopharyngeal Swab  Result Value Ref Range Status   SARS Coronavirus 2 NEGATIVE NEGATIVE Final    Comment: (NOTE) SARS-CoV-2 target nucleic acids are NOT DETECTED. The SARS-CoV-2 RNA is generally detectable in upper and lower respiratory specimens during the acute phase of infection. Negative results do not preclude SARS-CoV-2 infection, do not rule out co-infections with other pathogens, and should not be used as the sole basis for treatment or other patient management decisions. Negative results must be combined with clinical observations, patient history, and epidemiological information. The expected result is Negative. Fact Sheet for Patients: SugarRoll.be Fact Sheet for Healthcare Providers: https://www.woods-mathews.com/ This test is not yet approved or cleared by the [place] FDA and  has been authorized for detection and/or diagnosis of SARS-CoV-2 by FDA under an Emergency Use Authorization (EUA). This EUA will remain  in effect (meaning this test can be used) for the duration of the COVID-19 declaration under Section 56 4(b)(1) of the Act, 21 U.S.C. section 360bbb-3(b)(1), unless the authorization is terminated or revoked sooner. Performed at [facility], [place] [address] [place], [place] [address]          Radiology Studies: No results found.      Scheduled Meds: . clotrimazole  1 Applicatorful Vaginal QHS  . enoxaparin (LOVENOX) injection  40 mg Subcutaneous Q24H  . ipratropium-albuterol  3 mL Nebulization TID  . lisinopril  10 mg Oral Daily  . metoprolol succinate  50 mg Oral Daily  . pantoprazole  40 mg Oral BID AC   Continuous Infusions:    LOS: 6 days    Time spent: 30 minutes    Ezekiel Slocumb, DO [facility]   If 7PM-7AM, please contact night-coverage [email or web address] 12/17/2019, 5:34 PM

## 2019-12-17 NOTE — TOC Progression Note (Signed)
Transition of Care [facility]) - Progression Note    Patient Details  Name: Krista Parker MRN: [id number] Date of Birth: 04-04-1932  Transition [facility]) CM/SW Contact  Kyvon Hu, Gardiner Rhyme, LCSW Phone Number: 12/17/2019, 12:41 PM  Clinical Narrative:  Met with pt and daughter to let know still working on getting her over to [facility] but will need negative COVID test. It is still pending. [facility] has a cut off of 3:00 pm for admissions, not sure if can make this deadline     Expected Discharge Plan: [place] Barriers to Discharge: Continued Medical Work up  Expected Discharge Plan and Services Expected Discharge Plan: [place] In-house Referral: Clinical Social Work   Post Acute Care Choice: Home Health, Durable Medical Equipment Living arrangements for the past 2 months: Single Family Home                                       Social Determinants of Health (SDOH) Interventions    Readmission Risk Interventions No flowsheet data found.

## 2019-12-17 NOTE — Progress Notes (Signed)
Occupational Therapy Treatment Patient Details Name: Krista Parker MRN: [id number] DOB: 10/02/1931 Today's Date: 12/17/2019    History of present illness Krista Parker is a 86 y.o. female with medical history significant for hypertension the, diagnosed with pneumonia 1 week prior who presented to the emergency room for continued shortness of breath in spite of outpatient treatment; admitted for inpatient management of multifocal PNA.  Received both Covid shots, last one on 12/02/2019 and tested negative for Covid on 12/07/2019   OT comments  Krista Parker was seen for OT treatment on this date. Upon arrival to room pt alert, having just finished breathing treatment. Pt seated upright in bed, denies pain, and agreeable to tx. Pt instructed in energy conservation strategies, log roll for comfort, and falls prevention. Pt tolerated ~3 mins bathing standing sinkside c SBA + VCs for PLB. Increased WOB noted -pt completed bathing seated on BSC c SETUP for face, chest, arms, tops of legs and MAX A for back, LE, and feet. MOD I hair brushing and UBD seated on BSC. Pt fatigued after bathing and requiring MAX A don/doff B socks at bed level at end of session. Pt verbalized understanding of instruction provided. Pt making good progress toward goals. Pt continues to benefit from skilled OT services to maximize return to PLOF and minimize risk of future falls, injury, caregiver burden, and readmission. Will continue to follow POC. Discharge recommendation remains appropriate.    Follow Up Recommendations  SNF    Equipment Recommendations  Other (comment)(defer to next level of care)    Recommendations for Other Services      Precautions / Restrictions Precautions Precautions: Fall Restrictions Weight Bearing Restrictions: No       Mobility Bed Mobility Overal bed mobility: Needs Assistance Bed Mobility: Supine to Sit;Sit to Supine     Supine to sit: Modified independent  (Device/Increase time);HOB elevated Sit to supine: Min assist(Assist for BLE management 2/2 pt fatigue)   General bed mobility comments: Increased WOB noted c SpO2 stable t/o bed mobility  Transfers Overall transfer level: Needs assistance   Transfers: Sit to/from Stand;Stand Pivot Transfers Sit to Stand: Supervision Stand pivot transfers: Min guard       General transfer comment: VCs for PLB    Balance Overall balance assessment: Needs assistance Sitting-balance support: No upper extremity supported;Feet supported Sitting balance-Leahy Scale: Good     Standing balance support: During functional activity;Single extremity supported Standing balance-Leahy Scale: Fair                             ADL either performed or assessed with clinical judgement   ADL Overall ADL's : Needs assistance/impaired                                       General ADL Comments: Pt tolerated ~ 9mins bathing standing sinkside c SBA + VCs for PLB. Bathing completed seated on BSC c SETUP for face, chest, arms, tops of legs and MAX A for back, LE, and feet. MOD I hair brushing and UBD seated on BSC. MAX A don/doff B socks at bed level (pt fatigued by bathing and requesting OT assist).      Vision       Perception     Praxis      Cognition Arousal/Alertness: Awake/alert Behavior During Therapy: WFL for tasks assessed/performed Overall Cognitive Status: Within  Functional Limits for tasks assessed                                 General Comments: A&O x 4, cooperative and pleasant        Exercises Other Exercises Other Exercises: Pt educated re: falls prevention, home/routines modifications, energy conservation strategies Other Exercises: Bathing, hair brushing, standing/sitting balance/tolerance, SPT, sup<>sit, sit<>stand x2, bed mobility, don/doff gown and B socks    Shoulder Instructions       General Comments      Pertinent Vitals/ Pain        Pain Assessment: No/denies pain  Home Living                                          Prior Functioning/Environment              Frequency  Min 1X/week        Progress Toward Goals  OT Goals(current goals can now be found in the care plan section)  Progress towards OT goals: Progressing toward goals  Acute Rehab OT Goals Patient Stated Goal: " I want to move without help" OT Goal Formulation: With patient Time For Goal Achievement: 12/26/19 Potential to Achieve Goals: Good ADL Goals Pt Will Perform Lower Body Dressing: with supervision;sit to/from stand Pt Will Transfer to Toilet: with supervision;stand pivot transfer Pt Will Perform Toileting - Clothing Manipulation and hygiene: with supervision;sit to/from stand Additional ADL Goal #1: Patient will improve standing activity tolerance to 5 minutes to perform ADLs with increased level of independence at [facility].  Plan Discharge plan remains appropriate;Frequency remains appropriate    Co-evaluation                 AM-PAC OT "6 Clicks" Daily Activity     Outcome Measure   Help from another person eating meals?: None Help from another person taking care of personal grooming?: None Help from another person toileting, which includes using toliet, bedpan, or urinal?: A Little Help from another person bathing (including washing, rinsing, drying)?: A Little Help from another person to put on and taking off regular upper body clothing?: None Help from another person to put on and taking off regular lower body clothing?: A Little 6 Click Score: 21    End of Session Equipment Utilized During Treatment: Oxygen(3 L humidified [place])  OT Visit Diagnosis: Unsteadiness on feet (R26.81);Muscle weakness (generalized) (M62.81);History of falling (Z91.81)   Activity Tolerance Patient tolerated treatment well   Patient Left in bed;with call bell/phone within reach   Nurse Communication          Time:  [id number] OT Time Calculation (min): 31 min  Charges: OT General Charges $OT Visit: 1 Visit OT Treatments $Self Care/Home Management : 23-37 mins  Dessie Coma, M.S. OTR/L  12/17/19, 10:15 AM

## 2019-12-17 NOTE — Discharge Instructions (Signed)
Acute Respiratory Failure, Adult  Acute respiratory failure occurs when there is not enough oxygen passing from your lungs to your body. When this happens, your lungs have trouble removing carbon dioxide from the blood. This causes your blood oxygen level to drop too low as carbon dioxide builds up. Acute respiratory failure is a medical emergency. It can develop quickly, but it is temporary if treated promptly. Your lung capacity, or how much air your lungs can hold, may improve with time, exercise, and treatment. What are the causes? There are many possible causes of acute respiratory failure, including:  Lung injury.  Chest injury or damage to the ribs or tissues near the lungs.  Lung conditions that affect the flow of air and blood into and out of the lungs, such as pneumonia, acute respiratory distress syndrome, and cystic fibrosis.  Medical conditions, such as strokes or spinal cord injuries, that affect the muscles and nerves that control breathing.  Blood infection (sepsis).  Inflammation of the pancreas (pancreatitis).  A blood clot in the lungs (pulmonary embolism).  A large-volume blood transfusion.  Burns.  Near-drowning.  Seizure.  Smoke inhalation.  Reaction to medicines.  Alcohol or drug overdose. What increases the risk? This condition is more likely to develop in people who have:  A blocked airway.  Asthma.  A condition or disease that damages or weakens the muscles, nerves, bones, or tissues that are involved in breathing.  A serious infection.  A health problem that blocks the unconscious reflex that is involved in breathing, such as hypothyroidism or sleep apnea.  A lung injury or trauma. What are the signs or symptoms? Trouble breathing is the main symptom of acute respiratory failure. Symptoms may also include:  Rapid breathing.  Restlessness or anxiety.  Skin, lips, or fingernails that appear blue (cyanosis).  Rapid heart  rate.  Abnormal heart rhythms (arrhythmias).  Confusion or changes in behavior.  Tiredness or loss of energy.  Feeling sleepy or having a loss of consciousness. How is this diagnosed? Your health care provider can diagnose acute respiratory failure with a medical history and physical exam. During the exam, your health care provider will listen to your heart and check for crackling or wheezing sounds in your lungs. Your may also have tests to confirm the diagnosis and determine what is causing respiratory failure. These tests may include:  Measuring the amount of oxygen in your blood (pulse oximetry). The measurement comes from a small device that is placed on your finger, earlobe, or toe.  Other blood tests to measure blood gases and to look for signs of infection.  Sampling your cerebral spinal fluid or tracheal fluid to check for infections.  Chest X-ray to look for fluid in spaces that should be filled with air.  Electrocardiogram (ECG) to look at the heart's electrical activity. How is this treated? Treatment for this condition usually takes places in a hospital intensive care unit (ICU). Treatment depends on what is causing the condition. It may include one or more treatments until your symptoms improve. Treatment may include:  Supplemental oxygen. Extra oxygen is given through a tube in the nose, a face mask, or a hood.  A device such as a continuous positive airway pressure (CPAP) or bi-level positive airway pressure (BiPAP or BPAP) machine. This treatment uses mild air pressure to keep the airways open. A mask or other device will be placed over your nose or mouth. A tube that is connected to a motor will deliver oxygen through the   mask.  Ventilator. This treatment helps move air into and out of the lungs. This may be done with a bag and mask or a machine. For this treatment, a tube is placed in your windpipe (trachea) so air and oxygen can flow to the lungs.  Extracorporeal  membrane oxygenation (ECMO). This treatment temporarily takes over the function of the heart and lungs, supplying oxygen and removing carbon dioxide. ECMO gives the lungs a chance to recover. It may be used if a ventilator is not effective.  Tracheostomy. This is a procedure that creates a hole in the neck to insert a breathing tube.  Receiving fluids and medicines.  Rocking the bed to help breathing. Follow these instructions at home:  Take over-the-counter and prescription medicines only as told by your health care provider.  Return to normal activities as told by your health care provider. Ask your health care provider what activities are safe for you.  Keep all follow-up visits as told by your health care provider. This is important. How is this prevented? Treating infections and medical conditions that may lead to acute respiratory failure can help prevent the condition from developing. Contact a health care provider if:  You have a fever.  Your symptoms do not improve or they get worse. Get help right away if:  You are having trouble breathing.  You lose consciousness.  Your have cyanosis or turn blue.  You develop a rapid heart rate.  You are confused. These symptoms may represent a serious problem that is an emergency. Do not wait to see if the symptoms will go away. Get medical help right away. Call your local emergency services (911 in the [place] Do not drive yourself to the hospital. This information is not intended to replace advice given to you by your health care provider. Make sure you discuss any questions you have with your health care provider. Document Revised: 08/19/2017 Document Reviewed: 03/24/2016 [employer] Patient Education  2020 [employer]  

## 2019-12-17 NOTE — Care Management Important Message (Signed)
Important Message  Patient Details  Name: Krista Parker MRN: [id number] Date of Birth: January 07, 1932   Medicare Important Message Given:  Yes     Dannette Barbara 12/17/2019, 10:43 AM

## 2019-12-17 NOTE — Discharge Summary (Addendum)
Physician Discharge Summary  Krista Parker [id number] DOB: 06/01/1932 DOA: 12/11/2019  PCP: Leonel Ramsay, MD  Admit date: 12/11/2019 Discharge date: 12/18/2019  Admitted From: Home Disposition:  SNF [facility]    Recommendations for Outpatient Follow-up:  1. Follow up with PCP in 1-2 weeks 2. Please obtain BMP/CBC in one week 3. Please follow up patient's blood pressure.  Her HCTZ was stopped due to hyponatremia.  Lisinopril was increased, metoprolol continued.  Please reassess for any necessary adjustments. 4. Please ensure resolution of patient's vaginal irritation once treatment complete.  Being treated with antifungal cream for yeast infection due to antibiotics.  Home Health: No  Equipment/Devices: None   Discharge Condition: Stable  CODE STATUS: DNR  Diet recommendation: Heart Healthy   Brief/Interim Summary:  Krista Chrapowitzkyis an 86 y.o.femalewith a past medical history that includes hypertension, ongoing dyspnea with exertion, recent pneumonia diagnosis on antibiotics, heart murmur, osteoporosis presented to the emergency department March 23 with complaint persistent and  worsening shortness of breath. Work-up in the emergency department revealed increased oxygen demand with oxygen saturation level 90% on room air and tachypnea with movement, hypertension, tachycardia, leukocytosis and hyponatremia as well as multifocal pneumonia with airspace opacity throughout much of the right lung as well as left upper lobe. She was provided with IV antibiotics as well as nebulizers and oxygen supplementation.  Aute respiratory failure with hypoxia secondary to  Community-acquired pneumonia in the setting of a mild COPD exacerbation Patient failed outpatient therapy with Augmentin and Levaquin.  Does not require supplemental oxygen at baseline.  Completed antibiotics and steroids during admission.  Cultures negative.  Continues to require 2-3 L/min supplemental  oxygen upon discharge.  Expect will be able to wean off as she recovers. --Albuterol inhaler as needed --Incentive spirometry --continue supplemental oxygen as needed to maintain O2 sat > 90%, wean off as tolerated.  Dyspnea on exertion Aortic insufficiency Mitral regurgitation Chart review in care everywhere reveals patient has recently been evaluated by cardiology for persistent dyspnea on exertion.  Echo with an EF of 60% and grade 1 diastolic dysfunction. Note also indicates nonrheumatic mitral valve regurg and afterload reduction added recommended low-sodium diet.   Cardiology's note dated 2 weeks ago indicates plan to evaluate for pulmonary etiology for dyspnea with exertion. --keepScheduled cardiology follow-up April 13th --Management as above --Patient given Lasix twice without significant improvement.   --can continue Lasix 40 mg PO daily PRN for edema or weight gain --Daily weights  Leukocytosis in the setting of lactic acid of 1.1, procalcitonin <0.10, likely due to steroids will taper down accordingly.  Improving. --recheck CBC in 1-2 weeks  Hypertensive urgency -present on admission with BP 183/102 Essential Hypertension - Home medications include lisinopril and Toprol. HCTZ was stopped due to hyponatremia. --continue lisinopril (dose increased), metoprolol --monitor BP and adjustment regimen as needed  Hyponatremia -present on admission, sodium level 128 on admission >> 133, 135, 134, 133 and stable.  Improved with IV hydration.  Etiology unclear. May be related to medications in the setting of hypovolemia, dehydration, in setting of infection.  Hypotonic serum, hypertonic urine. --HCTZ was stopped --recheck BMP in 1-2 weeks  Right Shoulder Pain with Weakness - suspicious for rotator cuff tear/strain.  Patient reports a long history of issues with her shoulder, but at home is able to use it and is active.  She now cannot perform shoulder flexion, has to use opposite  arm to lift the right arm.  She did have a fall/near-fall in her  shower at home shortly before admission.   Vaginal irritation - possible yeast infection from antibiotics --antifungal cream qHS x 7 days    Discharge Diagnoses: Principal Problem:   Acute respiratory failure with hypoxia (HCC) Active Problems:   COPD with acute exacerbation (HCC)   CAP (community acquired pneumonia)   Hypertensive urgency   Hyponatremia   Aortic insufficiency   Mitral regurgitation    Discharge Instructions    Allergies as of 12/17/2019   No Known Allergies     Medication List    STOP taking these medications   levofloxacin 500 MG tablet Commonly known as: LEVAQUIN   predniSONE 20 MG tablet Commonly known as: DELTASONE     TAKE these medications   acetaminophen 650 MG CR tablet Commonly known as: TYLENOL Take 650 mg by mouth daily as needed for pain.   albuterol 108 (90 Base) MCG/ACT inhaler Commonly known as: VENTOLIN HFA Inhale 2 puffs into the lungs every 6 (six) hours as needed for wheezing or shortness of breath.   ALPRAZolam 0.25 MG tablet Commonly known as: XANAX Take 1 tablet (0.25 mg total) by mouth 3 (three) times daily as needed for up to 14 days for anxiety.   alum & mag hydroxide-simeth 200-200-20 MG/5ML suspension Commonly known as: MAALOX/MYLANTA Take 30 mLs by mouth every 4 (four) hours as needed for indigestion or heartburn.   CALCIUM-VITAMIN D PO Take 1-2 each by mouth daily. Take 2 tablets in the morning and 1 tablet in the evening   clotrimazole 1 % vaginal cream Commonly known as: GYNE-LOTRIMIN Place 1 Applicatorful vaginally at bedtime.   diphenhydrAMINE 25 mg capsule Commonly known as: BENADRYL Take 25 mg by mouth daily as needed for allergies.   lisinopril 10 MG tablet Commonly known as: ZESTRIL Take 1 tablet (10 mg total) by mouth daily. What changed:   medication strength  how much to take  additional instructions   loratadine 10 MG  tablet Commonly known as: CLARITIN Take 10 mg by mouth at bedtime as needed for allergies.   melatonin 5 MG Tabs Take 1 tablet (5 mg total) by mouth at bedtime as needed (sleep).   metoprolol succinate 50 MG 24 hr tablet Commonly known as: TOPROL-XL Take 50 mg by mouth daily. Take with or immediately following a meal.   multivitamin with minerals Tabs tablet Take 1 tablet by mouth daily.   Naproxen Sodium 220 MG Caps Take 220 mg by mouth 2 (two) times daily as needed (pain).   omeprazole 20 MG capsule Commonly known as: PRILOSEC Take 20 mg by mouth 2 (two) times daily before a meal.   polyethylene glycol powder 17 GM/SCOOP powder Commonly known as: GLYCOLAX/MIRALAX Take 17 g by mouth at bedtime as needed for moderate constipation.       No Known Allergies  Consultations:  none    Procedures/Studies: DG Shoulder Right  Result Date: 12/14/2019 CLINICAL DATA:  Chronic right shoulder pain. EXAM: RIGHT SHOULDER - 2+ VIEW COMPARISON:  None. FINDINGS: There is no evidence of fracture or dislocation. There is no evidence of arthropathy or other focal bone abnormality. Soft tissues are unremarkable. IMPRESSION: Negative. Electronically Signed   By: Marijo Conception M.D.   On: 12/14/2019 15:40   CT Angio Chest PE W and/or Wo Contrast  Result Date: 12/11/2019 CLINICAL DATA:  86 year old female with shortness of breath. EXAM: CT ANGIOGRAPHY CHEST WITH CONTRAST TECHNIQUE: Multidetector CT imaging of the chest was performed using the standard protocol during bolus administration of  intravenous contrast. Multiplanar CT image reconstructions and MIPs were obtained to evaluate the vascular anatomy. CONTRAST:  64mL OMNIPAQUE IOHEXOL 350 MG/ML SOLN COMPARISON:  Chest radiograph dated 06/11/2016. FINDINGS: Cardiovascular: There is mild cardiomegaly. No pericardial effusion. Advanced 3 vessel coronary vascular calcification. There is mild atherosclerotic calcification of the thoracic aorta. No  aneurysmal dilatation or dissection. The right vertebral artery appears limited. The remainder of the visualized great vessels of the aortic arch appear patent. No CT evidence of pulmonary artery embolism. Mediastinum/Nodes: There is no hilar or mediastinal adenopathy. There is a moderate hiatal hernia. No mediastinal fluid collection. Lungs/Pleura: There is background of centrilobular emphysema. Bilateral patchy and streaky airspace opacity and reticular densities most consistent with multifocal pneumonia. Clinical correlation and follow-up to resolution recommended. There is a small right pleural effusion. No pneumothorax. The central airways are patent. Upper Abdomen: A 1 cm left hepatic cyst and additional subcentimeter hypodensity PH is too small to characterize. Musculoskeletal: Osteopenia with degenerative changes of the spine and multilevel lower thoracic old appearing compression fractures. No definite acute osseous pathology. Review of the MIP images confirms the above findings. IMPRESSION: 1. No CT evidence of pulmonary artery embolism. 2. Multifocal pneumonia. Clinical correlation and follow-up to resolution recommended. 3. Small right pleural effusion. 4. Mild cardiomegaly with advanced 3 vessel coronary vascular calcification. 5. Moderate hiatal hernia. 6. Aortic Atherosclerosis (ICD10-I70.0). Electronically Signed   By: Anner Crete M.D.   On: 12/11/2019 22:37   DG Chest Portable 1 View  Result Date: 12/11/2019 CLINICAL DATA:  Shortness of breath EXAM: PORTABLE CHEST 1 VIEW COMPARISON:  Chest CT June 11, 2016 FINDINGS: There is extensive airspace opacifications throughout much of the right upper and lower lung regions. Ill-defined opacity is also noted in the left upper lobe. Heart is upper normal in size with pulmonary vascularity normal. No adenopathy. No bone lesions. IMPRESSION: Multifocal pneumonia with airspace opacity throughout much of the right lung as well as left upper lobe.  Heart is upper normal in size with pulmonary vascularity normal. No adenopathy. Electronically Signed   By: Lowella Grip III M.D.   On: 12/11/2019 20:51       Subjective: Patient reports feeling anxious about "everything going on today".  Reports feeling well.  No fever/chills, chest pain, SOB at rest, N/V/D or other complaints.  Reports vaginal irritation is better.   Discharge Exam: Vitals:   12/16/19 1814 12/16/19 2359  BP: 138/69 (!) 176/79  Pulse: 85 77  Resp: 17 (!) 22  Temp: 97.6 F (36.4 C) 97.8 F (36.6 C)  SpO2: 96% 97%   Vitals:   12/16/19 0830 12/16/19 1407 12/16/19 1814 12/16/19 2359  BP:   138/69 (!) 176/79  Pulse:   85 77  Resp:   17 (!) 22  Temp:   97.6 F (36.4 C) 97.8 F (36.6 C)  TempSrc:   Oral Oral  SpO2: 92% 95% 96% 97%  Weight:      Height:        General: Pt is alert, awake, not in acute distress Cardiovascular: RRR, S1/S2 +, no rubs, no gallops Respiratory: diminished at bases bilaterally otherwise clear, no wheezing, no rhonchi, on 3 L/min nasal cannula oxygen Abdominal: Soft, NT, ND, bowel sounds + Extremities: no edema, no cyanosis    The results of significant diagnostics from this hospitalization (including imaging, microbiology, ancillary and laboratory) are listed below for reference.     Microbiology: Recent Results (from the past 240 hour(s))  Blood culture (routine x 2)  Status: None   Collection Time: 12/11/19  8:15 PM   Specimen: BLOOD  Result Value Ref Range Status   Specimen Description BLOOD RIGHT ANTECUBITAL  Final   Special Requests   Final    BOTTLES DRAWN AEROBIC AND ANAEROBIC Blood Culture results may not be optimal due to an excessive volume of blood received in culture bottles   Culture   Final    NO GROWTH 5 DAYS Performed at [facility], [address] [place], [place] [address]    Report Status 12/16/2019 FINAL  Final  Blood culture (routine x 2)     Status: None   Collection Time:  12/11/19  8:15 PM   Specimen: BLOOD  Result Value Ref Range Status   Specimen Description BLOOD LEFT ANTECUBITAL  Final   Special Requests   Final    BOTTLES DRAWN AEROBIC AND ANAEROBIC Blood Culture results may not be optimal due to an excessive volume of blood received in culture bottles   Culture   Final    NO GROWTH 5 DAYS Performed at [facility], [address] [place],  [address]    Report Status 12/16/2019 FINAL  Final  Respiratory Panel by RT PCR (Flu A&B, Covid) - Nasopharyngeal Swab     Status: None   Collection Time: 12/11/19 11:24 PM   Specimen: Nasopharyngeal Swab  Result Value Ref Range Status   SARS Coronavirus 2 by RT PCR NEGATIVE NEGATIVE Final    Comment: (NOTE) SARS-CoV-2 target nucleic acids are NOT DETECTED. The SARS-CoV-2 RNA is generally detectable in upper respiratoy specimens during the acute phase of infection. The lowest concentration of SARS-CoV-2 viral copies this assay can detect is 131 copies/mL. A negative result does not preclude SARS-Cov-2 infection and should not be used as the sole basis for treatment or other patient management decisions. A negative result may occur with  improper specimen collection/handling, submission of specimen other than nasopharyngeal swab, presence of viral mutation(s) within the areas targeted by this assay, and inadequate number of viral copies (<131 copies/mL). A negative result must be combined with clinical observations, patient history, and epidemiological information. The expected result is Negative. Fact Sheet for Patients:  PinkCheek.be Fact Sheet for Healthcare Providers:  GravelBags.it This test is not yet ap proved or cleared by the [place] FDA and  has been authorized for detection and/or diagnosis of SARS-CoV-2 by FDA under an Emergency Use Authorization (EUA). This EUA will remain  in effect (meaning this test can be used)  for the duration of the COVID-19 declaration under Section 564(b)(1) of the Act, 21 U.S.C. section 360bbb-3(b)(1), unless the authorization is terminated or revoked sooner.    Influenza A by PCR NEGATIVE NEGATIVE Final   Influenza B by PCR NEGATIVE NEGATIVE Final    Comment: (NOTE) The Xpert Xpress SARS-CoV-2/FLU/RSV assay is intended as an aid in  the diagnosis of influenza from Nasopharyngeal swab specimens and  should not be used as a sole basis for treatment. Nasal washings and  aspirates are unacceptable for Xpert Xpress SARS-CoV-2/FLU/RSV  testing. Fact Sheet for Patients: PinkCheek.be Fact Sheet for Healthcare Providers: GravelBags.it This test is not yet approved or cleared by the [place] FDA and  has been authorized for detection and/or diagnosis of SARS-CoV-2 by  FDA under an Emergency Use Authorization (EUA). This EUA will remain  in effect (meaning this test can be used) for the duration of the  Covid-19 declaration under Section 564(b)(1) of the Act, 21  U.S.C. section  360bbb-3(b)(1), unless the authorization is  terminated or revoked. Performed at [facility], [address] [place], [place] [address]      Labs: BNP (last 3 results) Recent Labs    12/11/19 2022 12/13/19 0607  BNP 278.0* [id number]   Basic Metabolic Panel: Recent Labs  Lab 12/13/19 0607 12/14/19 0405 12/15/19 0635 12/16/19 0527 12/17/19 0542  NA 133* 135 134* 133* 133*  K 3.4* 3.5 3.4* 3.5 3.6  CL 100 101 97* 99 96*  CO2 25 26 27 29 27   GLUCOSE 104* 97 86 96 79  BUN 21 23 22 18 14   CREATININE 0.62 0.66 0.72 0.61 0.65  CALCIUM 8.8* 8.9 8.9 8.4* 8.7*  MG  --   --   --  2.1  --    Liver Function Tests: Recent Labs  Lab 12/11/19 2015  AST 28  ALT 23  ALKPHOS 86  BILITOT 0.7  PROT 7.4  ALBUMIN 3.6   No results for input(s): LIPASE, AMYLASE in the last 168 hours. No results for input(s): AMMONIA in the last  168 hours. CBC: Recent Labs  Lab 12/13/19 0607 12/14/19 0405 12/15/19 0635 12/16/19 0527 12/17/19 0542  WBC 21.2* 15.3* 12.7* 14.9* 12.0*  NEUTROABS 18.1* 12.2* 9.5* 11.2* 8.8*  HGB 12.5 12.0 12.9 11.9* 12.9  HCT 36.2 35.5* 37.9 36.4 38.8  MCV 83.6 85.1 84.2 86.3 86.4  PLT 422* 429* 431* 425* 457*   Cardiac Enzymes: No results for input(s): CKTOTAL, CKMB, CKMBINDEX, TROPONINI in the last 168 hours. BNP: Invalid input(s): POCBNP CBG: No results for input(s): GLUCAP in the last 168 hours. D-Dimer No results for input(s): DDIMER in the last 72 hours. Hgb A1c No results for input(s): HGBA1C in the last 72 hours. Lipid Profile No results for input(s): CHOL, HDL, LDLCALC, TRIG, CHOLHDL, LDLDIRECT in the last 72 hours. Thyroid function studies No results for input(s): TSH, T4TOTAL, T3FREE, THYROIDAB in the last 72 hours.  Invalid input(s): FREET3 Anemia work up No results for input(s): VITAMINB12, FOLATE, FERRITIN, TIBC, IRON, RETICCTPCT in the last 72 hours. Urinalysis    Component Value Date/Time   COLORURINE STRAW (A) 12/12/2019 0725   APPEARANCEUR CLEAR (A) 12/12/2019 0725   LABSPEC 1.020 12/12/2019 0725   PHURINE 7.0 12/12/2019 0725   GLUCOSEU NEGATIVE 12/12/2019 0725   HGBUR NEGATIVE 12/12/2019 0725   BILIRUBINUR NEGATIVE 12/12/2019 0725   KETONESUR 5 (A) 12/12/2019 0725   PROTEINUR NEGATIVE 12/12/2019 0725   NITRITE NEGATIVE 12/12/2019 0725   LEUKOCYTESUR NEGATIVE 12/12/2019 0725   Sepsis Labs Invalid input(s): PROCALCITONIN,  WBC,  LACTICIDVEN Microbiology Recent Results (from the past 240 hour(s))  Blood culture (routine x 2)     Status: None   Collection Time: 12/11/19  8:15 PM   Specimen: BLOOD  Result Value Ref Range Status   Specimen Description BLOOD RIGHT ANTECUBITAL  Final   Special Requests   Final    BOTTLES DRAWN AEROBIC AND ANAEROBIC Blood Culture results may not be optimal due to an excessive volume of blood received in culture bottles    Culture   Final    NO GROWTH 5 DAYS Performed at [facility], [address], [place], [place] [address]    Report Status 12/16/2019 FINAL  Final  Blood culture (routine x 2)     Status: None   Collection Time: 12/11/19  8:15 PM   Specimen: BLOOD  Result Value Ref Range Status   Specimen Description BLOOD LEFT ANTECUBITAL  Final   Special Requests  Final    BOTTLES DRAWN AEROBIC AND ANAEROBIC Blood Culture results may not be optimal due to an excessive volume of blood received in culture bottles   Culture   Final    NO GROWTH 5 DAYS Performed at [facility], [address] [place], [place] [address]    Report Status 12/16/2019 FINAL  Final  Respiratory Panel by RT PCR (Flu A&B, Covid) - Nasopharyngeal Swab     Status: None   Collection Time: 12/11/19 11:24 PM   Specimen: Nasopharyngeal Swab  Result Value Ref Range Status   SARS Coronavirus 2 by RT PCR NEGATIVE NEGATIVE Final    Comment: (NOTE) SARS-CoV-2 target nucleic acids are NOT DETECTED. The SARS-CoV-2 RNA is generally detectable in upper respiratoy specimens during the acute phase of infection. The lowest concentration of SARS-CoV-2 viral copies this assay can detect is 131 copies/mL. A negative result does not preclude SARS-Cov-2 infection and should not be used as the sole basis for treatment or other patient management decisions. A negative result may occur with  improper specimen collection/handling, submission of specimen other than nasopharyngeal swab, presence of viral mutation(s) within the areas targeted by this assay, and inadequate number of viral copies (<131 copies/mL). A negative result must be combined with clinical observations, patient history, and epidemiological information. The expected result is Negative. Fact Sheet for Patients:  PinkCheek.be Fact Sheet for Healthcare Providers:  GravelBags.it This test is not yet ap  proved or cleared by the [place] FDA and  has been authorized for detection and/or diagnosis of SARS-CoV-2 by FDA under an Emergency Use Authorization (EUA). This EUA will remain  in effect (meaning this test can be used) for the duration of the COVID-19 declaration under Section 564(b)(1) of the Act, 21 U.S.C. section 360bbb-3(b)(1), unless the authorization is terminated or revoked sooner.    Influenza A by PCR NEGATIVE NEGATIVE Final   Influenza B by PCR NEGATIVE NEGATIVE Final    Comment: (NOTE) The Xpert Xpress SARS-CoV-2/FLU/RSV assay is intended as an aid in  the diagnosis of influenza from Nasopharyngeal swab specimens and  should not be used as a sole basis for treatment. Nasal washings and  aspirates are unacceptable for Xpert Xpress SARS-CoV-2/FLU/RSV  testing. Fact Sheet for Patients: PinkCheek.be Fact Sheet for Healthcare Providers: GravelBags.it This test is not yet approved or cleared by the [place] FDA and  has been authorized for detection and/or diagnosis of SARS-CoV-2 by  FDA under an Emergency Use Authorization (EUA). This EUA will remain  in effect (meaning this test can be used) for the duration of the  Covid-19 declaration under Section 564(b)(1) of the Act, 21  U.S.C. section 360bbb-3(b)(1), unless the authorization is  terminated or revoked. Performed at [facility], [address] [place], [place] [address]      Time coordinating discharge: Over 30 minutes  SIGNED:   Ezekiel Slocumb, DO [facility] 12/17/2019, 7:38 AM   If 7PM-7AM, please contact night-coverage [email or web address]

## 2019-12-17 NOTE — TOC Progression Note (Signed)
Transition of Care [facility] - Progression Note    Patient Details  Name: Krista Parker MRN: [id number] Date of Birth: 01/04/1932  Transition of [facility]) CM/SW Contact  Zoii Florer, Gardiner Rhyme, LCSW Phone Number: 12/17/2019, 3:55 PM  Clinical Narrative:   Still do not have the results of the COVID test which was done at 10:00 am. WOM reports they can not make she her O2 is set up and her medications are gotten for her. Pt is quite anxious about this and wants an answer. MD aware and Bedside RN aware, she reports she called and it was sent to [facility] and will not be looked at until 4:45 pm. Pt aware and calmer, daughter at facility filling out paperwork. Plan for transfer to [facility] in am.    Expected Discharge Plan: [place] Barriers to Discharge: Barriers Resolved  Expected Discharge Plan and Services Expected Discharge Plan: [place] In-house Referral: Clinical Social Work   Post Acute Care Choice: Home Health, Durable Medical Equipment Living arrangements for the past 2 months: Single Family Home Expected Discharge Date: 12/17/19                                     Social Determinants of Health (SDOH) Interventions    Readmission Risk Interventions No flowsheet data found.

## 2019-12-17 NOTE — TOC Transition Note (Deleted)
Transition of Care [facility]) - CM/SW Discharge Note   Patient Details  Name: Krista Parker MRN: [id number] Date of Birth: 1931/09/28  Transition of Care [facility]) CM/SW Contact:  Elease Hashimoto, LCSW Phone Number: 12/17/2019, 1:24 PM   Clinical Narrative:  Met with pt and daughter who is here at the bedside discuss transfer to [facility].      Final next level of care: Skilled Nursing Facility Barriers to Discharge: Barriers Resolved   Patient Goals and CMS Choice Patient states their goals for this hospitalization and ongoing recovery are:: I want to get back to where I was walking two miles a day   Choice offered to / list presented to : Patient  Discharge Placement PASRR number recieved: 12/12/19            Patient chooses bed at: [facility] Patient to be transferred to facility by: EMS Name of family member notified: daughter Patient and family notified of of transfer: 12/17/19  Discharge Plan and Services In-house Referral: Clinical Social Work   Post Acute Care Choice: Home Health, Durable Medical Equipment                               Social Determinants of Health (SDOH) Interventions     Readmission Risk Interventions No flowsheet data found.

## 2019-12-17 NOTE — Progress Notes (Signed)
PT Cancellation Note  Patient Details Name: Krista Parker MRN: [id number] DOB: 1932-04-27   Cancelled Treatment:    Reason Eval/Treat Not Completed: Other (comment)(Pt with respiratory at bedside for breathing treatment, PT to re-attempt as able.)   Lieutenant Diego PT, DPT 8:50 AM,12/17/19

## 2019-12-18 LAB — CREATININE, SERUM
Creatinine, Ser: 0.61 mg/dL (ref 0.44–1.00)
GFR calc Af Amer: >60 mL/min (ref >60)
GFR calc non Af Amer: >60 mL/min (ref >60)

## 2019-12-18 NOTE — TOC Transition Note (Signed)
Transition of Care [facility]) - CM/SW Discharge Note   Patient Details  Name: Krista Parker MRN: [id number] Date of Birth: 02/16/1932  Transition of [facility]) CM/SW Contact:  Elease Hashimoto, LCSW Phone Number: 12/18/2019, 8:09 AM   Clinical Narrative:   Pt's COVID test is back and it is negative, can plan to sent pt to [facility] today. Pt, bedside RN and daughter aware of this plan. Bedside RN to call report to [phone]. Going to room 203. Packet  in chart. Plan to set up EMS for 10:00 am. No further follow due to DC today.    Final next level of care: Skilled Nursing Facility Barriers to Discharge: Barriers Resolved   Patient Goals and CMS Choice Patient states their goals for this hospitalization and ongoing recovery are:: I want to get back to where I was walking two miles a day   Choice offered to / list presented to : Patient  Discharge Placement PASRR number recieved: 12/12/19            Patient chooses bed at: [facility] Patient to be transferred to facility by: EMS Name of family member notified: daughter Patient and family notified of of transfer: 12/18/19  Discharge Plan and Services In-house Referral: Clinical Social Work   Post Acute Care Choice: Home Health, Durable Medical Equipment                               Social Determinants of Health (SDOH) Interventions     Readmission Risk Interventions No flowsheet data found.

## 2020-12-14 ENCOUNTER — Inpatient Hospital Stay (HOSPITAL_COMMUNITY)
Admission: EM | Admit: 2020-12-14 | Discharge: 2020-12-20 | DRG: 521 | Disposition: A | Discharge status: Skilled Nursing Facility | Source: Skilled Nursing Facility | Attending: Internal Medicine | Admitting: Internal Medicine

## 2020-12-14 ENCOUNTER — Other Ambulatory Visit: Payer: Self-pay

## 2020-12-14 ENCOUNTER — Emergency Department (HOSPITAL_COMMUNITY)

## 2020-12-14 ENCOUNTER — Encounter (HOSPITAL_COMMUNITY): Payer: Self-pay | Admitting: Internal Medicine

## 2020-12-14 DIAGNOSIS — D62 Acute posthemorrhagic anemia: Secondary | ICD-10-CM | POA: Diagnosis not present

## 2020-12-14 DIAGNOSIS — Y92092 Bedroom in other non-institutional residence as the place of occurrence of the external cause: Secondary | ICD-10-CM | POA: Diagnosis not present

## 2020-12-14 DIAGNOSIS — R296 Repeated falls: Secondary | ICD-10-CM | POA: Diagnosis present

## 2020-12-14 DIAGNOSIS — Z515 Encounter for palliative care: Secondary | ICD-10-CM | POA: Diagnosis not present

## 2020-12-14 DIAGNOSIS — J449 Chronic obstructive pulmonary disease, unspecified: Secondary | ICD-10-CM | POA: Diagnosis present

## 2020-12-14 DIAGNOSIS — I5032 Chronic diastolic (congestive) heart failure: Secondary | ICD-10-CM | POA: Diagnosis present

## 2020-12-14 DIAGNOSIS — E876 Hypokalemia: Secondary | ICD-10-CM | POA: Diagnosis present

## 2020-12-14 DIAGNOSIS — J4520 Mild intermittent asthma, uncomplicated: Secondary | ICD-10-CM | POA: Diagnosis present

## 2020-12-14 DIAGNOSIS — F419 Anxiety disorder, unspecified: Secondary | ICD-10-CM | POA: Diagnosis present

## 2020-12-14 DIAGNOSIS — Z20822 Contact with and (suspected) exposure to covid-19: Secondary | ICD-10-CM | POA: Diagnosis present

## 2020-12-14 DIAGNOSIS — E43 Unspecified severe protein-calorie malnutrition: Secondary | ICD-10-CM | POA: Diagnosis present

## 2020-12-14 DIAGNOSIS — W19XXXA Unspecified fall, initial encounter: Secondary | ICD-10-CM | POA: Diagnosis not present

## 2020-12-14 DIAGNOSIS — S72001A Fracture of unspecified part of neck of right femur, initial encounter for closed fracture: Secondary | ICD-10-CM | POA: Diagnosis not present

## 2020-12-14 DIAGNOSIS — Z9181 History of falling: Secondary | ICD-10-CM | POA: Diagnosis not present

## 2020-12-14 DIAGNOSIS — Z23 Encounter for immunization: Secondary | ICD-10-CM

## 2020-12-14 DIAGNOSIS — I110 Hypertensive heart disease with heart failure: Secondary | ICD-10-CM | POA: Diagnosis present

## 2020-12-14 DIAGNOSIS — W010XXA Fall on same level from slipping, tripping and stumbling without subsequent striking against object, initial encounter: Secondary | ICD-10-CM | POA: Diagnosis present

## 2020-12-14 DIAGNOSIS — I9589 Other hypotension: Secondary | ICD-10-CM | POA: Diagnosis not present

## 2020-12-14 DIAGNOSIS — R54 Age-related physical debility: Secondary | ICD-10-CM | POA: Diagnosis not present

## 2020-12-14 DIAGNOSIS — G2581 Restless legs syndrome: Secondary | ICD-10-CM | POA: Diagnosis present

## 2020-12-14 DIAGNOSIS — Z66 Do not resuscitate: Secondary | ICD-10-CM | POA: Diagnosis present

## 2020-12-14 DIAGNOSIS — I8393 Asymptomatic varicose veins of bilateral lower extremities: Secondary | ICD-10-CM | POA: Diagnosis present

## 2020-12-14 DIAGNOSIS — M810 Age-related osteoporosis without current pathological fracture: Secondary | ICD-10-CM | POA: Diagnosis present

## 2020-12-14 DIAGNOSIS — Z87891 Personal history of nicotine dependence: Secondary | ICD-10-CM

## 2020-12-14 DIAGNOSIS — Z9981 Dependence on supplemental oxygen: Secondary | ICD-10-CM | POA: Diagnosis not present

## 2020-12-14 DIAGNOSIS — I480 Paroxysmal atrial fibrillation: Secondary | ICD-10-CM | POA: Diagnosis not present

## 2020-12-14 DIAGNOSIS — J9611 Chronic respiratory failure with hypoxia: Secondary | ICD-10-CM | POA: Diagnosis present

## 2020-12-14 DIAGNOSIS — K219 Gastro-esophageal reflux disease without esophagitis: Secondary | ICD-10-CM | POA: Diagnosis present

## 2020-12-14 DIAGNOSIS — Z96649 Presence of unspecified artificial hip joint: Secondary | ICD-10-CM

## 2020-12-14 DIAGNOSIS — Z6830 Body mass index (BMI) 30.0-30.9, adult: Secondary | ICD-10-CM

## 2020-12-14 DIAGNOSIS — Z419 Encounter for procedure for purposes other than remedying health state, unspecified: Secondary | ICD-10-CM

## 2020-12-14 DIAGNOSIS — R531 Weakness: Secondary | ICD-10-CM | POA: Diagnosis not present

## 2020-12-14 DIAGNOSIS — Z79899 Other long term (current) drug therapy: Secondary | ICD-10-CM

## 2020-12-14 DIAGNOSIS — L8990 Pressure ulcer of unspecified site, unspecified stage: Secondary | ICD-10-CM | POA: Insufficient documentation

## 2020-12-14 DIAGNOSIS — Z85828 Personal history of other malignant neoplasm of skin: Secondary | ICD-10-CM | POA: Diagnosis not present

## 2020-12-14 DIAGNOSIS — S72091A Other fracture of head and neck of right femur, initial encounter for closed fracture: Principal | ICD-10-CM | POA: Diagnosis present

## 2020-12-14 DIAGNOSIS — I4891 Unspecified atrial fibrillation: Secondary | ICD-10-CM | POA: Diagnosis not present

## 2020-12-14 DIAGNOSIS — M62838 Other muscle spasm: Secondary | ICD-10-CM | POA: Diagnosis not present

## 2020-12-14 DIAGNOSIS — I950 Idiopathic hypotension: Secondary | ICD-10-CM | POA: Diagnosis not present

## 2020-12-14 LAB — BASIC METABOLIC PANEL
Anion gap: 9 (ref 5–15)
BUN: 10 mg/dL (ref 8–23)
CO2: 28 mmol/L (ref 22–32)
Calcium: 9.2 mg/dL (ref 8.9–10.3)
Chloride: 97 mmol/L — ABNORMAL LOW (ref 98–111)
Creatinine, Ser: 0.82 mg/dL (ref 0.44–1.00)
GFR, Estimated: >60 mL/min (ref >60)
Glucose, Bld: 94 mg/dL (ref 70–99)
Potassium: 3 mmol/L — ABNORMAL LOW (ref 3.5–5.1)
Sodium: 134 mmol/L — ABNORMAL LOW (ref 135–145)

## 2020-12-14 LAB — CBC WITH DIFFERENTIAL/PLATELET
Abs Immature Granulocytes: 0.67 10*3/uL — ABNORMAL HIGH (ref 0.00–0.07)
Basophils Absolute: 0 10*3/uL (ref 0.0–0.1)
Basophils Relative: 0 %
Eosinophils Absolute: 0.1 10*3/uL (ref 0.0–0.5)
Eosinophils Relative: 1 %
HCT: 39.8 % (ref 36.0–46.0)
Hemoglobin: 13.7 g/dL (ref 12.0–15.0)
Immature Granulocytes: 5 %
Lymphocytes Relative: 12 %
Lymphs Abs: 1.6 10*3/uL (ref 0.7–4.0)
MCH: 29.4 pg (ref 26.0–34.0)
MCHC: 34.4 g/dL (ref 30.0–36.0)
MCV: 85.4 fL (ref 80.0–100.0)
Monocytes Absolute: 0.6 10*3/uL (ref 0.1–1.0)
Monocytes Relative: 5 %
Neutro Abs: 9.8 10*3/uL — ABNORMAL HIGH (ref 1.7–7.7)
Neutrophils Relative %: 77 %
Platelets: 266 10*3/uL (ref 150–400)
RBC: 4.66 MIL/uL (ref 3.87–5.11)
RDW: 15.7 % — ABNORMAL HIGH (ref 11.5–15.5)
WBC: 12.8 10*3/uL — ABNORMAL HIGH (ref 4.0–10.5)
nRBC: 0 % (ref 0.0–0.2)

## 2020-12-14 LAB — ABO/RH: ABO/RH(D): O POS

## 2020-12-14 LAB — TYPE AND SCREEN
ABO/RH(D): O POS
Antibody Screen: NEGATIVE

## 2020-12-14 LAB — SURGICAL PCR SCREEN
MRSA, PCR: NEGATIVE
Staphylococcus aureus: POSITIVE — AB

## 2020-12-14 LAB — PROTIME-INR
INR: 1.1 (ref 0.8–1.2)
Prothrombin Time: 13.4 seconds (ref 11.4–15.2)

## 2020-12-14 LAB — CK: Total CK: 43 U/L (ref 38–234)

## 2020-12-14 LAB — RESP PANEL BY RT-PCR (FLU A&B, COVID) ARPGX2
Influenza A by PCR: NEGATIVE
Influenza B by PCR: NEGATIVE
SARS Coronavirus 2 by RT PCR: NEGATIVE

## 2020-12-14 MED ORDER — SENNA 8.6 MG PO TABS
1.0000 | ORAL_TABLET | Freq: Two times a day (BID) | ORAL | Status: DC
  Administered 2020-12-14 – 2020-12-17 (×4): 8.6 mg via ORAL
  Filled 2020-12-14 (×5): qty 1

## 2020-12-14 MED ORDER — SERTRALINE HCL 25 MG PO TABS
25.0000 mg | ORAL_TABLET | Freq: Every morning | ORAL | Status: DC
  Administered 2020-12-14 – 2020-12-17 (×3): 25 mg via ORAL
  Filled 2020-12-14 (×5): qty 1

## 2020-12-14 MED ORDER — FENTANYL CITRATE (PF) 100 MCG/2ML IJ SOLN
50.0000 ug | INTRAMUSCULAR | Status: DC | PRN
  Administered 2020-12-14: 50 ug via INTRAVENOUS
  Filled 2020-12-14: qty 2

## 2020-12-14 MED ORDER — CLONAZEPAM 0.5 MG PO TABS
0.5000 mg | ORAL_TABLET | Freq: Three times a day (TID) | ORAL | Status: DC
  Administered 2020-12-14 – 2020-12-15 (×3): 0.5 mg via ORAL
  Filled 2020-12-14 (×3): qty 1

## 2020-12-14 MED ORDER — POTASSIUM CHLORIDE 20 MEQ PO PACK
40.0000 meq | PACK | Freq: Two times a day (BID) | ORAL | Status: AC
  Administered 2020-12-14 (×2): 40 meq via ORAL
  Filled 2020-12-14 (×2): qty 2

## 2020-12-14 MED ORDER — ONDANSETRON 4 MG PO TBDP
4.0000 mg | ORAL_TABLET | ORAL | Status: DC | PRN
  Filled 2020-12-14: qty 1

## 2020-12-14 MED ORDER — ALPRAZOLAM 0.25 MG PO TABS
0.2500 mg | ORAL_TABLET | ORAL | Status: DC | PRN
  Administered 2020-12-14: 0.25 mg via ORAL
  Filled 2020-12-14: qty 1

## 2020-12-14 MED ORDER — FENTANYL CITRATE (PF) 100 MCG/2ML IJ SOLN
50.0000 ug | INTRAMUSCULAR | Status: AC | PRN
  Administered 2020-12-14 (×2): 50 ug via INTRAVENOUS
  Filled 2020-12-14 (×2): qty 2

## 2020-12-14 MED ORDER — PANTOPRAZOLE SODIUM 40 MG PO TBEC
40.0000 mg | DELAYED_RELEASE_TABLET | Freq: Every day | ORAL | Status: DC
  Administered 2020-12-14 – 2020-12-18 (×4): 40 mg via ORAL
  Filled 2020-12-14 (×4): qty 1

## 2020-12-14 MED ORDER — METOPROLOL SUCCINATE ER 50 MG PO TB24
50.0000 mg | ORAL_TABLET | Freq: Every morning | ORAL | Status: DC
  Administered 2020-12-14 – 2020-12-15 (×2): 50 mg via ORAL
  Filled 2020-12-14 (×2): qty 1

## 2020-12-14 MED ORDER — HYDROMORPHONE HCL 1 MG/ML IJ SOLN
1.0000 mg | INTRAMUSCULAR | Status: DC
Start: 2020-12-14 — End: 2020-12-16
  Administered 2020-12-14 – 2020-12-15 (×8): 1 mg via INTRAVENOUS
  Filled 2020-12-14 (×7): qty 1

## 2020-12-14 MED ORDER — CALCIUM CARBONATE ANTACID 500 MG PO CHEW
500.0000 mg | CHEWABLE_TABLET | Freq: Three times a day (TID) | ORAL | Status: DC | PRN
  Administered 2020-12-16 – 2020-12-19 (×5): 500 mg via ORAL
  Filled 2020-12-14 (×5): qty 3

## 2020-12-14 MED ORDER — ALUM & MAG HYDROXIDE-SIMETH 200-200-20 MG/5ML PO SUSP: 30.0000 mL | ORAL | Status: DC | PRN

## 2020-12-14 MED ORDER — ONDANSETRON HCL 4 MG/2ML IJ SOLN
4.0000 mg | Freq: Once | INTRAMUSCULAR | Status: AC
  Administered 2020-12-14: 4 mg via INTRAVENOUS
  Filled 2020-12-14: qty 2

## 2020-12-14 MED ORDER — CHLORHEXIDINE GLUCONATE CLOTH 2 % EX PADS
6.0000 | MEDICATED_PAD | Freq: Every day | CUTANEOUS | Status: AC
  Administered 2020-12-15 – 2020-12-19 (×5): 6 via TOPICAL

## 2020-12-14 MED ORDER — MUPIROCIN 2 % EX OINT
1.0000 "application " | TOPICAL_OINTMENT | Freq: Two times a day (BID) | CUTANEOUS | Status: AC
  Administered 2020-12-14 – 2020-12-19 (×9): 1 via NASAL
  Filled 2020-12-14 (×3): qty 22

## 2020-12-14 MED ORDER — POLYETHYLENE GLYCOL 3350 17 GM/SCOOP PO POWD
8.5000 g | Freq: Every evening | ORAL | Status: DC | PRN
  Filled 2020-12-14: qty 255

## 2020-12-14 MED ORDER — ALBUTEROL SULFATE HFA 108 (90 BASE) MCG/ACT IN AERS
2.0000 | INHALATION_SPRAY | Freq: Four times a day (QID) | RESPIRATORY_TRACT | Status: DC | PRN
  Filled 2020-12-14: qty 6.7

## 2020-12-14 NOTE — Hospital Course (Addendum)
  Admitted 12/14/2020  Allergies: Tape, Cefdinir, and Lisinopril Pertinent Hx: On hospice, COPD/Asthma on 3L ,HFpEF, osteoporosis   87 y.o. female p/w right femoral neck fracture  * Right femoral neck fracture: Right hemiarthroplasty 3/28 with Dr.Xu.   *Atrial Fibrillation- New onset, in setting of surgery. Soft BP, started on Amio and improved overnight, Amio stopped today.  Consults: Ortho Meds: Acetaminophen, albuterol, mylanta, calcium carbonate, Klonopin,ensure, mag citrate, senna , zofran, zoloft VTE ppx: Lovenox SCDs IVF: none Diet: Hear

## 2020-12-14 NOTE — H&P (Signed)
Date: 12/14/2020               Patient Name:  Krista Parker MRN: [id number]  DOB: 09-26-1931 Age / Sex: 87 y.o., female   PCP: Leonel Ramsay, MD         Medical Service: Internal Medicine Teaching Service         Attending Physician: Dr. Dorian Pod, MD    First Contact: Dr. Foy Guadalajara, MD Pager: [phone]  Second Contact: Dr. Tamsen Snider, MD Pager: [phone]       After Hours (After 5p/  First Contact Pager: [phone]  weekends / holidays): Second Contact Pager: [phone]   Chief Complaint: Right hip fracture  History of Present Illness: Krista Parker is a 87 year old woman living at [facility] currently on hospice care, PCP Dr. Ola Spurr at [facility] within [place], and past medical history significant for chronic hypoxic respiratory failure (on 3L oxygen continuously at baseline), mild intermittent asthma, prior tobacco use (55-pack year smoking history), HFpEF (grade 1 diastolic dysfunction, EF of 60%, mitral regurgitation and aortic insufficiency), osteoporosis, HTN and GERD who presented to [facility] on 12/14/20 following a fall.   History obtained from ED personnel, patient's daughter and patient. Patient has reportedly been declining over the past several months with increasing confusion, worsening memory, weakness, instability and falls occurring upwards of weekly. Patient reports that last night around 4AM, she attempted to get out of her bed to ambulate to use her bathroom. Approximately halfway there, she had a fall and landed on her right side. She immediately felt the worst pain in her life in her right hip which has been constant with worsening shooting pains with any movement. She used her Life Alert button to request help. Unfortunately, patient was locked in her room and there was difficulty accessing her room due to an issue with the keypad. The door of her room was removed and patient was transported to [facility] via ambulance.  Patient was down for approximately one hour prior to arrival.  According to patient's daughter, patient is on Hospice at [facility] due to COPD and heart failure, although review of her pulmonology records does not reveal a formal diagnosis of COPD. She is able to communicate effectively, however some days are better than others. She has periods of confusion and difficulty with memory. She has assistance in the morning and evenings by staff and her daughter attempts to stay at the facility during the day. Patient requires assistance to ambulate from her bed to chair as patient shuffles her feet when walking. She does not use a cane or walker. Patient requires a wheelchair for any mobility outside of her room.   ED Course: On arrival to the ED, patient afebrile, hemodynamically stable and saturating at 98% on home 3L oxygen via nasal cannula. EKG demonstrating normal sinus rhythm and diffuse T-wave inversions. Initial labs: BMP with K of 3.0, Na 134; CBC with WBC 12.8, Hb 13.7; INR of 1.1. Imaging with hip radiographs revealing displaced right femoral neck fracture; CXR revealing improved aeration compared to prior equivocal 35mm right apical nodule and distal right clavicle fracture which is new from prior but non-acute; and CT Head with no acute intracranial abnormality. Patient received 49mcg of IV fentanyl twice and 4mg  IV ondansetron. Dr. Erlinda Hong with orthopedics discussed with patient and patient's daughter with plan to proceed with repair upon medical clearance. IMTS contacted for admission.  Medications:  . Albuterol 90 mcg/actuation inhaler Inhale 2 inhalations  into the lungs every six hours PRN  . Alprazolam 0.25mg  tablet every four hours . Alum & mag hydroxide-simeth 200-200-20 MG/5ML suspension 54mLs every four hours PRN . Calcium carbonate once daily PRN . Clonazepam 0.5mg  three times daily . Dexamethasone 4mg  daily with breakfast . Diphenhydramine 25 mg capsule once daily PRN . Fentanyl  20mcg/hr + 68mcg/hr every three days . Furosemide 40mg  daily . Haloperidol 1mg  every four hours PRN . Hydromorphone 2-4mg  every two hours PRN . Loratadine 10 mg capsule nightly PRN . Metoprolol succinate 50 MG XL daily . Multivitamin daily with breakfast . Omeprazole 40mg  twice daily . Ondansetron 4mg  every four hours PRN . Polyethylene glycol powder 17g nightly PRN . Potassium chloride 32mEq every morning . Senna 8.6mg  twice daily . Sertraline 25mg  daily  Allergies: Cefdinir, unspecified reaction  Past Medical History: -Benign essential HTN -Compression fracture of body of thoracic vertebrae -Compression fracture of lumbar vertebrae, non-traumatic -Osteoporosis -Mild intermittent asthma without complication -Multiple lung nodules on CT -Non-rheumatic mitral valve regurgitation -Non-rheumatic aortic valve insufficiency -Restless leg syndrome -Gastroesophageal reflux disease  Family History: Mother - heart failure Father - no known medical conditions Sister - Clotting disorder Brother - Aneurysm, esophageal cancer, lung cancer  Social History:  Lives at [facility], currently on hospice care. Widowed woman with three childrten. Previously worked as an Therapist, sports. Prior tobacco use with 1 pack per day for 55 years, however quit smoking on 04/21/2004. Currently consumes approximately one glass of wine weekly. No current or prior history of drug use.  Review of Systems: A complete ROS was negative except as per HPI.  Physical Exam: Blood pressure (!) 122/59, pulse 90, temperature (!) 97.5 F (36.4 C), temperature source Oral, resp. rate 17, height 5\' 1"  (1.549 m), weight 72.6 kg, SpO2 97 %. Physical Exam Constitutional:      Comments: Elderly woman, supine in hospital bed initially sleeping on arrival. Patient woke up during our interview with patient's daughter and initially comfortably resting in bed and pleasant. Patient became progressively more uncomfortable during  interview with moving of bilateral upper and left lower extremity. Patient moaning and repeating that she is in pain.  HENT:     Head:     Comments: Skin tear of right face with overlying clean bandage    Mouth/Throat:     Mouth: Mucous membranes are moist.     Pharynx: Oropharynx is clear.  Eyes:     Extraocular Movements: Extraocular movements intact.     Conjunctiva/sclera: Conjunctivae normal.  Cardiovascular:     Rate and Rhythm: Normal rate and regular rhythm.     Heart sounds: Normal heart sounds.     Comments: Nonpalpable pulses in bilateral feet (dorsalis pedis and posterior tibialis) Pulmonary:     Effort: Pulmonary effort is normal. No respiratory distress.     Comments: Anterior breath sounds unremarkable. Unable to assess posterior breath sounds due to inability to sit upright secondary to pain Abdominal:     General: Abdomen is flat. Bowel sounds are normal.     Palpations: Abdomen is soft.     Tenderness: There is no abdominal tenderness.  Musculoskeletal:        General: Tenderness present.     Right lower leg: No edema.     Left lower leg: No edema.     Comments: Significant tenderness along the right hip. No movement of the right lower extremity, full range of motion of left lower and bilateral upper extremities  Skin:    General:  Skin is warm and dry.     Capillary Refill: Capillary refill takes 2 to 3 seconds.     Findings: Bruising present.     Comments: Scattered echymoses along bilateral upper and lower extremities. Bilateral feet cold. Skin tears present on right elbow, left wrist, and right face  Psychiatric:        Attention and Perception: Attention normal.        Mood and Affect: Mood is anxious.        Speech: Speech normal.        Behavior: Behavior is cooperative.   EKG: personally reviewed my interpretation is normal sinus rhythm with diffuse T-wave inversions.  CXR: personally reviewed my interpretation is no acute cardiopulmonary process  although there is finding of an 53mm right apical nodule and distal right clavicle fracture which appears non-acute.  Assessment & Plan by Problem: Active Problems:   Closed right hip fracture (HCC)  Marri Mcneff is a 87 year old woman living at [facility] currently on hospice care, PCP Dr. Ola Spurr at [facility] within [place], and past medical history significant for chronic hypoxic respiratory failure (on 3L oxygen continuously at baseline), mild intermittent asthma, prior tobacco use (55-pack year smoking history), HFpEF (grade 1 diastolic dysfunction, EF of 60%, mitral regurgitation and aortic insufficiency), osteoporosis, HTN and GERD who presented to [facility] on 12/14/20 following a fall found to have a displaced right femoral neck fracture.  #Displaced right femoral neck fracture, acute #Osteoporosis, chronic Patient has weakness and instability at baseline and ambulates short distances with a shuffling gait without the use of a walker or cane. Patient is instructed to ambulate only with assistance, however her confusion leads to episodes of ambulating independently. Patient has been experiencing recurrent falls which have been increasing in frequency from approximately once monthly to weekly. This is her first fracture secondary to a fall. Although patient is on hospice care, surgical intervention would provide patient with increased quality of life and improvement in her pain. Additionally, patient may benefit from medication reconcilliation as she is currently prescribed multiple centrally-acting medications which increase her risk of falls and confusion including two benzodiazepines, a transdermal opioid patch, hydromorphone PRN, diphenhydramine PRN and haloperidol PRN. -Orthopedics consulted, greatly appreciate recommendations  -Right hemiarthoplasty tomorrow with Dr. Erlinda Hong -CK level -Continue home transdermal fentanyl patch -Start hydromorphone 1mg   Q4H -Start fentanyl 29mcg Q2H PRN for severe pain -Bedrest  #HTN, chronic #HFpEF, chronic Patient is currently prescribed daily metoprolol and furosemide. She appears euvolemic on examination and blood pressure well controlled despite her pain. -Continue home metoprolol 50mg  XR daily -Continue home furosemide 40mg  daily  #GERD, chronic Patient takes Maalox/Mylanta, Tums and omeprazole for her history of GERD. Chart review indicates a prior diagnosis of hiatal hernia. -Continue home Maalox/Mylanta 72mL Q4H PRN -Start pantoprazole 40mg  daily -Continue home calcium carbonate 500mg  three times daily PRN  #Chronic hypoxic respiratory failure on 3L oxygen via nasal cannula, chronic Patient saturating well on home oxygen requirement. CXR reveals improved aeration compared to prior images. Patient does have a right apical pulmonary nodule on imaging, however chart review indicates that patient has a history of multiple pulmonary nodules. -Continue home albuterol 2 puffs Q6H PRN -Continue home supplemental oxygen of 3L via nasal cannula  #Anxiety, chronic -Continue home alprazolam 0.25mg  Q4H PRN, not scheduled -Continue home clonazepam 0.5mg  three times daily -Continue home sertraline 25mg  daily  #Hypokalemia, active Patient takes 87mEq of KCl daily in the setting of her furosemide use. Potassium of  3.0 on admission. -Replete with 72mEq of KCl for two doses -Reassess on morning BMP -Check Mg in AM  #Code status: DNR #Diet: Regular, NPO at midnight #Bowel regimen: Senna 8.6mg  twice daily  Miralax 8.5g daily PRN #IVF: None  Dispo: Admit patient to Inpatient with expected length of stay greater than 2 midnights.  Signed: Cato Mulligan, MD 12/14/2020, 12:25 PM  Pager: [phone] After 5pm on weekdays and 1pm on weekends: On Call pager: [phone]

## 2020-12-14 NOTE — Plan of Care (Signed)
Patient admitted from ED. Daughter by the bedside. Medicated for pain as ordered. Multiple skin tear noted. Same dressed with foam dressing.

## 2020-12-14 NOTE — ED Triage Notes (Addendum)
Pt BIB EMS from facility for an unwitnessed fall. Was on the floor for roughly an hour, c/o R hip pain. 5/10 pain while laying, 10/10 with movement. Pt activated her life alert button. Skin tears noted to R side of face, R elbow, and L inner forearm. Pt states she is not on blood thinners.   Daughter states to EMS that pt is hospice and has DNR, but no forms arrive with pt to [facility] ED.   EMS vitals: 138/70 94% 3L [place] (baseline supplemental O2) HR 98 RR 20 CBG 107

## 2020-12-14 NOTE — ED Provider Notes (Signed)
[facility] EMERGENCY DEPARTMENT Provider Note   CSN: [id number] Arrival date & time: 12/14/20  4098     History Chief Complaint  Patient presents with  . Fall  . Leg Pain    Krista Parker is a 87 y.o. female with a past medical history of end-stage COPD and CHF with chronic hypoxic respiratory failure on 3 L at baseline currently under hospice care and presenting from [facility] after an unwitnessed fall.  The patient reports that she lost her balance and fell onto her right side.  She has severe pain in the right hip and skin tears of the face right elbow and left inner forearm.  The patient is up-to-date on her tetanus vaccine.  The patient also reports that there was a water source that dripped on her.  It took approximately 1 hour for staff to reach her because they did not have the code to her room, which was locked.  They ended up having to take off the door to reach her.  The patient denies loss of consciousness.  She is not on any blood thinners.  She is DNR.  She complains of 10 out of 10 pain of the left hip.  It is worse with any movement or touching.  EMS reports that the leg appears slightly shorter and pulses are diminished on the right side.Marland Kitchen  HPI     Past Medical History:  Diagnosis Date  . Aortic insufficiency   . Asthma   . History of abdominal pain   . Hypertension   . Mitral regurgitation   . Pneumonia   . Rib pain   . Superficial basal cell carcinoma (BCC) 07/15/2016   right neck    Patient Active Problem List   Diagnosis Date Noted  . Acute respiratory failure with hypoxia ([place]) 12/12/2019  . Aortic insufficiency   . Mitral regurgitation   . COPD with acute exacerbation ([place]) 12/11/2019  . CAP (community acquired pneumonia) 12/11/2019  . Essential hypertension 12/11/2019  . Hypoxia 12/11/2019  . Hypertensive urgency 12/11/2019  . Hyponatremia 12/11/2019  . Abnormal ECG 02/13/2014  . Murmur 02/13/2014  . History of  abdominal pain   . Rib pain     No past surgical history on file.   OB History   No obstetric history on file.     No family history on file.  Social History   Tobacco Use  . Smoking status: Former Research scientist (life sciences)  . Smokeless tobacco: Never Used  Substance Use Topics  . Alcohol use: Yes  . Drug use: No    Home Medications Prior to Admission medications   Medication Sig Start Date End Date Taking? Authorizing Provider  acetaminophen (TYLENOL) 650 MG CR tablet Take 650 mg by mouth daily as needed for pain.     [provider]  albuterol (VENTOLIN HFA) 108 (90 Base) MCG/ACT inhaler Inhale 2 puffs into the lungs every 6 (six) hours as needed for wheezing or shortness of breath. 12/17/19   Nicole Kindred A, DO  alum & mag hydroxide-simeth (MAALOX/MYLANTA) 200-200-20 MG/5ML suspension Take 30 mLs by mouth every 4 (four) hours as needed for indigestion or heartburn. 12/17/19   Nicole Kindred A, DO  CALCIUM-VITAMIN D PO Take 1-2 each by mouth daily. Take 2 tablets in the morning and 1 tablet in the evening    [provider]  clotrimazole (GYNE-LOTRIMIN) 1 % vaginal cream Place 1 Applicatorful vaginally at bedtime. 12/17/19   Ezekiel Slocumb, DO  diphenhydrAMINE (  BENADRYL) 25 mg capsule Take 25 mg by mouth daily as needed for allergies.     [provider]  furosemide (LASIX) 40 MG tablet Take 1 tablet (40 mg total) by mouth daily as needed for edema. 12/17/19 12/16/20  Nicole Kindred A, DO  lisinopril (ZESTRIL) 10 MG tablet Take 1 tablet (10 mg total) by mouth daily. 12/17/19   Ezekiel Slocumb, DO  loratadine (CLARITIN) 10 MG tablet Take 10 mg by mouth at bedtime as needed for allergies.    [provider]  melatonin 5 MG TABS Take 1 tablet (5 mg total) by mouth at bedtime as needed (sleep). 12/17/19   Nicole Kindred A, DO  metoprolol succinate (TOPROL-XL) 50 MG 24 hr tablet Take 50 mg by mouth daily. Take with or immediately following a meal.    [provider]  Multiple Vitamin (MULTIVITAMIN WITH MINERALS) TABS tablet Take 1 tablet by mouth daily.    [provider]  Naproxen Sodium 220 MG CAPS Take 220 mg by mouth 2 (two) times daily as needed (pain).     [provider]  omeprazole (PRILOSEC) 20 MG capsule Take 20 mg by mouth 2 (two) times daily before a meal.  06/07/15   [provider]  polyethylene glycol powder (GLYCOLAX/MIRALAX) powder Take 17 g by mouth at bedtime as needed for moderate constipation.     [provider]    Allergies    Patient has no known allergies.  Review of Systems   Review of Systems Ten systems reviewed and are negative for acute change, except as noted in the HPI.   Physical Exam Updated Vital Signs BP 117/76 (BP Location: Left Arm)   Pulse 86   Temp 98.6 F (37 C) (Oral)   Resp 14   Ht 5\' 1"  (1.549 m)   Wt 72.6 kg   SpO2 98%   BMI 30.23 kg/m   Physical Exam Vitals and nursing note reviewed.  Constitutional:      General: She is not in acute distress.    Appearance: She is well-developed. She is not diaphoretic.  HENT:     Head: Normocephalic.     Comments: Bruising over the left side of the face Eyes:     General: No scleral icterus.    Conjunctiva/sclera: Conjunctivae normal.  Cardiovascular:     Rate and Rhythm: Normal rate and regular rhythm.     Heart sounds: Normal heart sounds. No murmur heard. No friction rub. No gallop.   Pulmonary:     Effort: Pulmonary effort is normal. No respiratory distress.     Breath sounds: Normal breath sounds.  Abdominal:     General: Bowel sounds are normal. There is no distension.     Palpations: Abdomen is soft. There is no mass.     Tenderness: There is no abdominal tenderness. There is no guarding.  Musculoskeletal:     Cervical back: Normal range of motion.  Skin:    General: Skin is warm and dry.     Comments: Multiple skin tears  Neurological:     Mental Status: She is alert and oriented to  person, place, and time.  Psychiatric:        Behavior: Behavior normal.     ED Results / Procedures / Treatments   Labs (all labs ordered are listed, but only abnormal results are displayed) Labs Reviewed  RESP PANEL BY RT-PCR (FLU A&B, COVID) ARPGX2  BASIC METABOLIC PANEL  CBC WITH DIFFERENTIAL/PLATELET  PROTIME-INR  TYPE AND SCREEN    EKG None  Radiology No results found.  Procedures Procedures   Medications Ordered in ED Medications  fentaNYL (SUBLIMAZE) injection 50 mcg (has no administration in time range)  ondansetron (ZOFRAN) injection 4 mg (has no administration in time range)    ED Course  I have reviewed the triage vital signs and the nursing notes.  Pertinent labs & imaging results that were available during my care of the patient were reviewed by me and considered in my medical decision making (see chart for details).    MDM Rules/Calculators/A&P                          9:17 AM Patient here after mechanical fall.  Ordered and reviewed plain films that include right hip x-ray, head CT and chest x-ray.  Patient does have some complaint of right rib pain but no obvious step-off or crepitus, she is breathing normally and has a normal chest x-ray.  CT scan is negative for any acute intracranial abnormalities the right hip xray + for R femoral neck fracture.  I ordered and reviewed labs that include CBC which shows a white blood cell count of 12.8 less likely acute phase reaction, BMP with sodium of 134 and potassium of 3.  She has been typed and screened with O+ blood, respiratory panel pending. I discussed the case with Dr. Erlinda Hong who had a long discussed sedation with the patient's daughter at bedside and although patient is hospice care she is normally ambulatory at baseline and after discussion they have decided to proceed with repair upon medical clearance.  Patient's pain is well controlled at this time. Final Clinical Impression(s) / ED Diagnoses Final  diagnoses:  None    Rx / DC Orders ED Discharge Orders    None       Margarita Mail, PA-C 12/15/20 2005    Malvin Johns, MD 12/16/20 1501

## 2020-12-14 NOTE — ED Notes (Signed)
Patient transported to X-ray 

## 2020-12-14 NOTE — ED Notes (Signed)
Attempted to call report at this time. RN unable to take report.  

## 2020-12-14 NOTE — Plan of Care (Signed)
Patient is from hospice at [facility] and was admitted after she fell and fractured her right hip. Scheduled pain meds are given as ordered along with clonazepam to help her relax. Patient has multiple skin tears, new mepilex foam dsgs placed on the two on her left leg. Chronic O2 and foley catheter use. NPO after midnight, possible right hemiarthroplasty by Dr. Erlinda Hong tomorrow. Will continue to monitor and continue current POC.

## 2020-12-14 NOTE — ED Notes (Signed)
Cleaned and dressed skin tears on patient's face, right elbow, and left wrist with NS. Placed guaze on wounds. Pt is requesting more pain medicine.

## 2020-12-15 ENCOUNTER — Inpatient Hospital Stay (HOSPITAL_COMMUNITY)

## 2020-12-15 ENCOUNTER — Inpatient Hospital Stay (HOSPITAL_COMMUNITY): Admitting: Certified Registered Nurse Anesthetist

## 2020-12-15 ENCOUNTER — Encounter (HOSPITAL_COMMUNITY)
Admission: EM | Disposition: A | Discharge status: Skilled Nursing Facility | Payer: Self-pay | Source: Skilled Nursing Facility | Attending: Internal Medicine

## 2020-12-15 ENCOUNTER — Encounter (HOSPITAL_COMMUNITY): Payer: Self-pay | Admitting: Internal Medicine

## 2020-12-15 DIAGNOSIS — E43 Unspecified severe protein-calorie malnutrition: Secondary | ICD-10-CM | POA: Insufficient documentation

## 2020-12-15 DIAGNOSIS — S72001A Fracture of unspecified part of neck of right femur, initial encounter for closed fracture: Secondary | ICD-10-CM

## 2020-12-15 HISTORY — PX: HIP ARTHROPLASTY: SHX981

## 2020-12-15 LAB — BASIC METABOLIC PANEL
Anion gap: 8 (ref 5–15)
Anion gap: 8 (ref 5–15)
BUN: 13 mg/dL (ref 8–23)
BUN: 16 mg/dL (ref 8–23)
CO2: 28 mmol/L (ref 22–32)
CO2: 23 mmol/L (ref 22–32)
Calcium: 8.9 mg/dL (ref 8.9–10.3)
Calcium: 8.2 mg/dL — ABNORMAL LOW (ref 8.9–10.3)
Chloride: 100 mmol/L (ref 98–111)
Chloride: 103 mmol/L (ref 98–111)
Creatinine, Ser: 0.96 mg/dL (ref 0.44–1.00)
Creatinine, Ser: 0.92 mg/dL (ref 0.44–1.00)
GFR, Estimated: 58 mL/min — ABNORMAL LOW (ref >60)
GFR, Estimated: >60 mL/min (ref >60)
Glucose, Bld: 110 mg/dL — ABNORMAL HIGH (ref 70–99)
Glucose, Bld: 140 mg/dL — ABNORMAL HIGH (ref 70–99)
Potassium: 3.7 mmol/L (ref 3.5–5.1)
Potassium: 4.1 mmol/L (ref 3.5–5.1)
Sodium: 136 mmol/L (ref 135–145)
Sodium: 134 mmol/L — ABNORMAL LOW (ref 135–145)

## 2020-12-15 LAB — CBC
HCT: 38.3 % (ref 36.0–46.0)
HCT: 35.1 % — ABNORMAL LOW (ref 36.0–46.0)
Hemoglobin: 13 g/dL (ref 12.0–15.0)
Hemoglobin: 11.8 g/dL — ABNORMAL LOW (ref 12.0–15.0)
MCH: 29 pg (ref 26.0–34.0)
MCH: 29.3 pg (ref 26.0–34.0)
MCHC: 33.9 g/dL (ref 30.0–36.0)
MCHC: 33.6 g/dL (ref 30.0–36.0)
MCV: 85.5 fL (ref 80.0–100.0)
MCV: 87.1 fL (ref 80.0–100.0)
Platelets: 228 10*3/uL (ref 150–400)
Platelets: 257 10*3/uL (ref 150–400)
RBC: 4.48 MIL/uL (ref 3.87–5.11)
RBC: 4.03 MIL/uL (ref 3.87–5.11)
RDW: 15.8 % — ABNORMAL HIGH (ref 11.5–15.5)
RDW: 15.9 % — ABNORMAL HIGH (ref 11.5–15.5)
WBC: 14.9 10*3/uL — ABNORMAL HIGH (ref 4.0–10.5)
WBC: 19.7 10*3/uL — ABNORMAL HIGH (ref 4.0–10.5)
nRBC: 0 % (ref 0.0–0.2)
nRBC: 0 % (ref 0.0–0.2)

## 2020-12-15 LAB — MAGNESIUM
Magnesium: 1.5 mg/dL — ABNORMAL LOW (ref 1.7–2.4)
Magnesium: 1.4 mg/dL — ABNORMAL LOW (ref 1.7–2.4)

## 2020-12-15 LAB — VITAMIN D 25 HYDROXY (VIT D DEFICIENCY, FRACTURES): Vit D, 25-Hydroxy: 34.33 ng/mL (ref 30–100)

## 2020-12-15 LAB — PHOSPHORUS: Phosphorus: 3.6 mg/dL (ref 2.5–4.6)

## 2020-12-15 SURGERY — HEMIARTHROPLASTY, HIP, DIRECT ANTERIOR APPROACH, FOR FRACTURE
Anesthesia: General | Site: Hip | Laterality: Right

## 2020-12-15 MED ORDER — AMIODARONE HCL IN DEXTROSE 360-4.14 MG/200ML-% IV SOLN
60.0000 mg/h | INTRAVENOUS | Status: DC
  Administered 2020-12-15: 60 mg/h via INTRAVENOUS
  Filled 2020-12-15: qty 200

## 2020-12-15 MED ORDER — FENTANYL CITRATE (PF) 100 MCG/2ML IJ SOLN
25.0000 ug | INTRAMUSCULAR | Status: DC | PRN
  Administered 2020-12-15 (×2): 25 ug via INTRAVENOUS

## 2020-12-15 MED ORDER — ALUM & MAG HYDROXIDE-SIMETH 200-200-20 MG/5ML PO SUSP: 30.0000 mL | ORAL | Status: DC | PRN

## 2020-12-15 MED ORDER — 0.9 % SODIUM CHLORIDE (POUR BTL) OPTIME
TOPICAL | Status: DC | PRN
  Administered 2020-12-15: 1000 mL

## 2020-12-15 MED ORDER — CLONAZEPAM 0.5 MG PO TABS
0.5000 mg | ORAL_TABLET | Freq: Two times a day (BID) | ORAL | Status: DC
  Administered 2020-12-15 – 2020-12-17 (×4): 0.5 mg via ORAL
  Filled 2020-12-15 (×5): qty 1

## 2020-12-15 MED ORDER — BUPIVACAINE-MELOXICAM ER 400-12 MG/14ML IJ SOLN
INTRAMUSCULAR | Status: AC
  Filled 2020-12-15: qty 1

## 2020-12-15 MED ORDER — FENTANYL CITRATE (PF) 250 MCG/5ML IJ SOLN
INTRAMUSCULAR | Status: DC | PRN
  Administered 2020-12-15 (×2): 25 ug via INTRAVENOUS
  Administered 2020-12-15: 50 ug via INTRAVENOUS
  Administered 2020-12-15: 25 ug via INTRAVENOUS

## 2020-12-15 MED ORDER — SODIUM CHLORIDE 0.9 % IR SOLN
Status: DC | PRN
  Administered 2020-12-15: 1000 mL

## 2020-12-15 MED ORDER — ONDANSETRON HCL 4 MG PO TABS: 4.0000 mg | ORAL_TABLET | Freq: Four times a day (QID) | ORAL | Status: DC | PRN

## 2020-12-15 MED ORDER — MAGNESIUM SULFATE 4 GM/100ML IV SOLN
4.0000 g | INTRAVENOUS | Status: AC
  Administered 2020-12-15: 4 g via INTRAVENOUS
  Filled 2020-12-15 (×2): qty 100

## 2020-12-15 MED ORDER — LACTATED RINGERS IV SOLN: INTRAVENOUS | Status: DC

## 2020-12-15 MED ORDER — MORPHINE SULFATE (PF) 2 MG/ML IV SOLN
0.5000 mg | INTRAVENOUS | Status: DC | PRN
Start: 2020-12-15 — End: 2020-12-16

## 2020-12-15 MED ORDER — SUGAMMADEX SODIUM 200 MG/2ML IV SOLN
INTRAVENOUS | Status: DC | PRN
  Administered 2020-12-15: 200 mg via INTRAVENOUS

## 2020-12-15 MED ORDER — ENOXAPARIN SODIUM 40 MG/0.4ML ~~LOC~~ SOLN: 40.0000 mg | Freq: Every day | SUBCUTANEOUS | 0 refills | Status: DC

## 2020-12-15 MED ORDER — ONDANSETRON HCL 4 MG/2ML IJ SOLN
INTRAMUSCULAR | Status: DC | PRN
  Administered 2020-12-15: 4 mg via INTRAVENOUS

## 2020-12-15 MED ORDER — PHENYLEPHRINE HCL-NACL 10-0.9 MG/250ML-% IV SOLN
INTRAVENOUS | Status: DC | PRN
  Administered 2020-12-15: 20 ug/min via INTRAVENOUS

## 2020-12-15 MED ORDER — FENTANYL CITRATE (PF) 100 MCG/2ML IJ SOLN
INTRAMUSCULAR | Status: AC
  Filled 2020-12-15: qty 2

## 2020-12-15 MED ORDER — ACETAMINOPHEN 500 MG PO TABS
1000.0000 mg | ORAL_TABLET | Freq: Four times a day (QID) | ORAL | Status: DC
  Administered 2020-12-16 – 2020-12-19 (×13): 1000 mg via ORAL
  Filled 2020-12-15 (×15): qty 2

## 2020-12-15 MED ORDER — PROPOFOL 10 MG/ML IV BOLUS
INTRAVENOUS | Status: DC | PRN
  Administered 2020-12-15: 120 mg via INTRAVENOUS

## 2020-12-15 MED ORDER — HYDROCODONE-ACETAMINOPHEN 5-325 MG PO TABS
1.0000 | ORAL_TABLET | ORAL | Status: DC | PRN
Start: 2020-12-15 — End: 2020-12-17
  Administered 2020-12-16: 2 via ORAL
  Administered 2020-12-17: 1 via ORAL
  Filled 2020-12-15 (×2): qty 2

## 2020-12-15 MED ORDER — PHENYLEPHRINE HCL-NACL 10-0.9 MG/250ML-% IV SOLN
0.0000 ug/min | INTRAVENOUS | Status: DC
  Filled 2020-12-15 (×2): qty 250

## 2020-12-15 MED ORDER — PROMETHAZINE HCL 25 MG/ML IJ SOLN: 6.2500 mg | INTRAMUSCULAR | Status: DC | PRN

## 2020-12-15 MED ORDER — ACETAMINOPHEN 325 MG PO TABS
325.0000 mg | ORAL_TABLET | Freq: Four times a day (QID) | ORAL | Status: DC | PRN
Start: 2020-12-16 — End: 2020-12-16

## 2020-12-15 MED ORDER — TRANEXAMIC ACID-NACL 1000-0.7 MG/100ML-% IV SOLN
INTRAVENOUS | Status: AC
  Filled 2020-12-15: qty 100

## 2020-12-15 MED ORDER — METHOCARBAMOL 1000 MG/10ML IJ SOLN
500.0000 mg | Freq: Four times a day (QID) | INTRAVENOUS | Status: DC | PRN
  Filled 2020-12-15: qty 5

## 2020-12-15 MED ORDER — OXYCODONE HCL 5 MG PO TABS: 5.0000 mg | ORAL_TABLET | Freq: Once | ORAL | Status: DC | PRN

## 2020-12-15 MED ORDER — MAGNESIUM CITRATE PO SOLN
1.0000 | Freq: Once | ORAL | Status: DC | PRN
  Filled 2020-12-15: qty 296

## 2020-12-15 MED ORDER — ENSURE ENLIVE PO LIQD
237.0000 mL | Freq: Three times a day (TID) | ORAL | Status: DC
  Administered 2020-12-16 – 2020-12-19 (×7): 237 mL via ORAL

## 2020-12-15 MED ORDER — LIDOCAINE 2% (20 MG/ML) 5 ML SYRINGE
INTRAMUSCULAR | Status: DC | PRN
  Administered 2020-12-15: 60 mg via INTRAVENOUS

## 2020-12-15 MED ORDER — PHENOL 1.4 % MT LIQD: 1.0000 | OROMUCOSAL | Status: DC | PRN

## 2020-12-15 MED ORDER — ONDANSETRON HCL 4 MG/2ML IJ SOLN: 4.0000 mg | Freq: Four times a day (QID) | INTRAMUSCULAR | Status: DC | PRN

## 2020-12-15 MED ORDER — METHOCARBAMOL 500 MG PO TABS: 500.0000 mg | ORAL_TABLET | Freq: Four times a day (QID) | ORAL | Status: DC | PRN

## 2020-12-15 MED ORDER — METOPROLOL TARTRATE 5 MG/5ML IV SOLN
1.0000 mg | Freq: Once | INTRAVENOUS | Status: AC
  Administered 2020-12-15: 1 mg via INTRAVENOUS

## 2020-12-15 MED ORDER — CHLORHEXIDINE GLUCONATE 0.12 % MT SOLN: 15.0000 mL | Freq: Once | OROMUCOSAL | Status: AC

## 2020-12-15 MED ORDER — AMIODARONE LOAD VIA INFUSION
150.0000 mg | Freq: Once | INTRAVENOUS | Status: AC
  Administered 2020-12-15: 150 mg via INTRAVENOUS
  Filled 2020-12-15: qty 83.34

## 2020-12-15 MED ORDER — ACETAMINOPHEN 500 MG PO TABS: 500.0000 mg | ORAL_TABLET | Freq: Four times a day (QID) | ORAL | Status: DC

## 2020-12-15 MED ORDER — HYDROCODONE-ACETAMINOPHEN 5-325 MG PO TABS
1.0000 | ORAL_TABLET | Freq: Four times a day (QID) | ORAL | 0 refills | Status: DC | PRN
Start: 2020-12-15 — End: 2020-12-19

## 2020-12-15 MED ORDER — ENOXAPARIN SODIUM 40 MG/0.4ML ~~LOC~~ SOLN
40.0000 mg | SUBCUTANEOUS | Status: DC
  Administered 2020-12-16 – 2020-12-18 (×3): 40 mg via SUBCUTANEOUS
  Filled 2020-12-15 (×3): qty 0.4

## 2020-12-15 MED ORDER — VANCOMYCIN HCL 1000 MG IV SOLR
INTRAVENOUS | Status: DC | PRN
  Administered 2020-12-15: 1000 mg via TOPICAL

## 2020-12-15 MED ORDER — CEFAZOLIN SODIUM-DEXTROSE 2-4 GM/100ML-% IV SOLN
2.0000 g | Freq: Four times a day (QID) | INTRAVENOUS | Status: AC
  Administered 2020-12-15 – 2020-12-16 (×3): 2 g via INTRAVENOUS
  Filled 2020-12-15 (×3): qty 100

## 2020-12-15 MED ORDER — HYDROCODONE-ACETAMINOPHEN 7.5-325 MG PO TABS: 1.0000 | ORAL_TABLET | ORAL | Status: DC | PRN

## 2020-12-15 MED ORDER — VANCOMYCIN HCL 1000 MG IV SOLR
INTRAVENOUS | Status: AC
  Filled 2020-12-15: qty 1000

## 2020-12-15 MED ORDER — METOPROLOL TARTRATE 5 MG/5ML IV SOLN
INTRAVENOUS | Status: AC
  Administered 2020-12-15: 1 mg
  Filled 2020-12-15: qty 5

## 2020-12-15 MED ORDER — POLYETHYLENE GLYCOL 3350 17 G PO PACK: 17.0000 g | PACK | Freq: Every day | ORAL | Status: DC | PRN

## 2020-12-15 MED ORDER — BUPIVACAINE-MELOXICAM ER 400-12 MG/14ML IJ SOLN
INTRAMUSCULAR | Status: DC | PRN
  Administered 2020-12-15: 400 mg

## 2020-12-15 MED ORDER — OXYCODONE HCL 5 MG/5ML PO SOLN: 5.0000 mg | Freq: Once | ORAL | Status: DC | PRN

## 2020-12-15 MED ORDER — MAGNESIUM SULFATE IN D5W 1-5 GM/100ML-% IV SOLN
1.0000 g | Freq: Once | INTRAVENOUS | Status: DC
  Filled 2020-12-15: qty 100

## 2020-12-15 MED ORDER — ROCURONIUM BROMIDE 10 MG/ML (PF) SYRINGE
PREFILLED_SYRINGE | INTRAVENOUS | Status: DC | PRN
  Administered 2020-12-15: 20 mg via INTRAVENOUS
  Administered 2020-12-15: 40 mg via INTRAVENOUS

## 2020-12-15 MED ORDER — FENTANYL CITRATE (PF) 250 MCG/5ML IJ SOLN
INTRAMUSCULAR | Status: AC
  Filled 2020-12-15: qty 5

## 2020-12-15 MED ORDER — TRANEXAMIC ACID-NACL 1000-0.7 MG/100ML-% IV SOLN
INTRAVENOUS | Status: DC | PRN
  Administered 2020-12-15: 1000 mg via INTRAVENOUS

## 2020-12-15 MED ORDER — DOCUSATE SODIUM 100 MG PO CAPS
100.0000 mg | ORAL_CAPSULE | Freq: Two times a day (BID) | ORAL | Status: DC
  Administered 2020-12-16: 100 mg via ORAL
  Filled 2020-12-15: qty 1

## 2020-12-15 MED ORDER — DEXAMETHASONE SODIUM PHOSPHATE 10 MG/ML IJ SOLN
INTRAMUSCULAR | Status: DC | PRN
  Administered 2020-12-15: 5 mg via INTRAVENOUS

## 2020-12-15 MED ORDER — POTASSIUM CHLORIDE 20 MEQ PO PACK: 20.0000 meq | PACK | Freq: Once | ORAL | Status: DC

## 2020-12-15 MED ORDER — TRANEXAMIC ACID 1000 MG/10ML IV SOLN
2000.0000 mg | Freq: Once | INTRAVENOUS | Status: DC
  Filled 2020-12-15: qty 20

## 2020-12-15 MED ORDER — SORBITOL 70 % SOLN: 30.0000 mL | Freq: Every day | Status: DC | PRN

## 2020-12-15 MED ORDER — ACETAMINOPHEN 500 MG PO TABS
1000.0000 mg | ORAL_TABLET | Freq: Once | ORAL | Status: AC
  Administered 2020-12-15: 1000 mg via ORAL
  Filled 2020-12-15: qty 2

## 2020-12-15 MED ORDER — EPHEDRINE SULFATE-NACL 50-0.9 MG/10ML-% IV SOSY
PREFILLED_SYRINGE | INTRAVENOUS | Status: DC | PRN
  Administered 2020-12-15 (×2): 80 mg via INTRAVENOUS
  Administered 2020-12-15: 120 mg via INTRAVENOUS
  Administered 2020-12-15: 80 mg via INTRAVENOUS

## 2020-12-15 MED ORDER — ORAL CARE MOUTH RINSE: 15.0000 mL | Freq: Once | OROMUCOSAL | Status: AC

## 2020-12-15 MED ORDER — EPHEDRINE SULFATE-NACL 50-0.9 MG/10ML-% IV SOSY
PREFILLED_SYRINGE | INTRAVENOUS | Status: DC | PRN
  Administered 2020-12-15: 10 mg via INTRAVENOUS
  Administered 2020-12-15: 5 mg via INTRAVENOUS

## 2020-12-15 MED ORDER — HYDROMORPHONE HCL 1 MG/ML IJ SOLN
INTRAMUSCULAR | Status: AC
  Filled 2020-12-15: qty 1

## 2020-12-15 MED ORDER — ADULT MULTIVITAMIN W/MINERALS CH
1.0000 | ORAL_TABLET | Freq: Every day | ORAL | Status: DC
  Administered 2020-12-16 – 2020-12-19 (×4): 1 via ORAL
  Filled 2020-12-15 (×4): qty 1

## 2020-12-15 MED ORDER — DIGOXIN 250 MCG PO TABS
0.2500 mg | ORAL_TABLET | ORAL | Status: AC
  Administered 2020-12-15: 0.25 mg via ORAL
  Filled 2020-12-15: qty 1

## 2020-12-15 MED ORDER — CEFAZOLIN SODIUM-DEXTROSE 2-3 GM-%(50ML) IV SOLR
INTRAVENOUS | Status: DC | PRN
  Administered 2020-12-15: 2 g via INTRAVENOUS

## 2020-12-15 MED ORDER — AMIODARONE HCL IN DEXTROSE 360-4.14 MG/200ML-% IV SOLN
30.0000 mg/h | INTRAVENOUS | Status: DC
  Filled 2020-12-15 (×2): qty 200

## 2020-12-15 MED ORDER — METOPROLOL TARTRATE 5 MG/5ML IV SOLN: 1.0000 mg | Freq: Once | INTRAVENOUS | Status: DC

## 2020-12-15 MED ORDER — CHLORHEXIDINE GLUCONATE 0.12 % MT SOLN
OROMUCOSAL | Status: AC
  Administered 2020-12-15: 15 mL via OROMUCOSAL
  Filled 2020-12-15: qty 15

## 2020-12-15 MED ORDER — LACTATED RINGERS IV BOLUS: 500.0000 mL | Freq: Once | INTRAVENOUS | Status: DC

## 2020-12-15 MED ORDER — MENTHOL 3 MG MT LOZG
1.0000 | LOZENGE | OROMUCOSAL | Status: DC | PRN
  Filled 2020-12-15: qty 9

## 2020-12-15 MED ORDER — SODIUM CHLORIDE 0.9 % IV SOLN: INTRAVENOUS | Status: DC

## 2020-12-15 SURGICAL SUPPLY — 51 items
BIPOLAR PROS AML 46 (Hips) ×2 IMPLANT
BLADE SAGITTAL (BLADE) ×1
BLADE SAW THK.89X75X18XSGTL (BLADE) ×1 IMPLANT
COVER PERINEAL POST (MISCELLANEOUS) ×2 IMPLANT
COVER SURGICAL LIGHT HANDLE (MISCELLANEOUS) ×2 IMPLANT
COVER WAND RF STERILE (DRAPES) ×2 IMPLANT
DRAPE HIP W/POCKET STRL (MISCELLANEOUS) ×2 IMPLANT
DRAPE IMP U-DRAPE 54X76 (DRAPES) ×2 IMPLANT
DRAPE INCISE IOBAN 85X60 (DRAPES) IMPLANT
DRAPE ORTHO SPLIT 77X108 STRL (DRAPES) ×2
DRAPE POUCH INSTRU U-SHP 10X18 (DRAPES) ×2 IMPLANT
DRAPE STERI IOBAN 125X83 (DRAPES) ×2 IMPLANT
DRAPE SURG ORHT 6 SPLT 77X108 (DRAPES) ×2 IMPLANT
DRAPE U-SHAPE 47X51 STRL (DRAPES) ×2 IMPLANT
DRESSING AQUACEL AG SP 3.5X10 (GAUZE/BANDAGES/DRESSINGS) ×1 IMPLANT
DRSG AQUACEL AG ADV 3.5X10 (GAUZE/BANDAGES/DRESSINGS) IMPLANT
DRSG AQUACEL AG SP 3.5X10 (GAUZE/BANDAGES/DRESSINGS) ×2
DRSG MEPILEX BORDER 4X8 (GAUZE/BANDAGES/DRESSINGS) ×2 IMPLANT
DURAPREP 26ML APPLICATOR (WOUND CARE) ×4 IMPLANT
ELECT CAUTERY BLADE 6.4 (BLADE) ×2 IMPLANT
ELECT REM PT RETURN 9FT ADLT (ELECTROSURGICAL) ×2
ELECTRODE REM PT RTRN 9FT ADLT (ELECTROSURGICAL) ×1 IMPLANT
EVACUATOR 1/8 PVC DRAIN (DRAIN) IMPLANT
FACESHIELD WRAPAROUND (MASK) IMPLANT
GLOVE ECLIPSE 7.0 STRL STRAW (GLOVE) ×2 IMPLANT
GLOVE SKINSENSE NS SZ7.5 (GLOVE) ×2
GLOVE SKINSENSE STRL SZ7.5 (GLOVE) ×2 IMPLANT
GLOVE SURG UNDER POLY LF SZ7 (GLOVE) ×2 IMPLANT
GOWN STRL REIN XL XLG (GOWN DISPOSABLE) ×2 IMPLANT
HEAD BIPOLAR PROS AML 46 (Hips) ×1 IMPLANT
HEAD FEM STD 28X+1.5 STRL (Hips) ×2 IMPLANT
HOOD PEEL AWAY FLYTE STAYCOOL (MISCELLANEOUS) ×4 IMPLANT
KIT BASIN OR (CUSTOM PROCEDURE TRAY) ×2 IMPLANT
KIT TURNOVER KIT B (KITS) ×2 IMPLANT
MANIFOLD NEPTUNE II (INSTRUMENTS) ×2 IMPLANT
NS IRRIG 1000ML POUR BTL (IV SOLUTION) ×2 IMPLANT
PACK TOTAL JOINT (CUSTOM PROCEDURE TRAY) ×2 IMPLANT
PACK UNIVERSAL I (CUSTOM PROCEDURE TRAY) ×2 IMPLANT
PAD ARMBOARD 7.5X6 YLW CONV (MISCELLANEOUS) ×4 IMPLANT
STAPLER VISISTAT 35W (STAPLE) IMPLANT
STEM FEM ACTIS STD SZ7 (Nail) ×2 IMPLANT
SUT ETHIBOND 2 V 37 (SUTURE) ×2 IMPLANT
SUT PDS AB 1 CT  36 (SUTURE) ×2
SUT PDS AB 1 CT 36 (SUTURE) ×2 IMPLANT
SUT VIC AB 0 CT1 27 (SUTURE)
SUT VIC AB 0 CT1 27XBRD ANBCTR (SUTURE) IMPLANT
SUT VIC AB 1 CTB1 27 (SUTURE) IMPLANT
SUT VIC AB 2-0 CT1 27 (SUTURE)
SUT VIC AB 2-0 CT1 TAPERPNT 27 (SUTURE) IMPLANT
TOWEL GREEN STERILE (TOWEL DISPOSABLE) ×2 IMPLANT
TOWEL GREEN STERILE FF (TOWEL DISPOSABLE) ×2 IMPLANT

## 2020-12-15 NOTE — TOC CAGE-AID Note (Signed)
Transition of Care [facility]) - CAGE-AID Screening   Patient Details  Name: Krista Parker MRN: [id number] Date of Birth: July 02, 1932  Transition of Care [facility]) CM/SW Contact:    Coralee Pesa, [place] Phone Number: 12/15/2020, 10:21 AM   Clinical Narrative: CSW completed CAGE -AID assessment with pt, dtr at bedside, permission was granted to conduct assessment with her present. Pt noted she was not drinking or using drugs at the time of her injury and did not feel she needed resources at this time. Daughter confirmed this as well.   CAGE-AID Screening:    Have You Ever Felt You Ought to Cut Down on Your Drinking or Drug Use?: No Have People Annoyed You By Critizing Your Drinking Or Drug Use?: No Have You Felt Bad Or Guilty About Your Drinking Or Drug Use?: No Have You Ever Had a Drink or Used Drugs First Thing In The Morning to Steady Your Nerves or to Get Rid of a Hangover?: No CAGE-AID Score: 0  Substance Abuse Education Offered: No (Pt declined at this time.)

## 2020-12-15 NOTE — Progress Notes (Signed)
1915 Dr. Marva Panda at bedside to speak with family see new orders

## 2020-12-15 NOTE — Progress Notes (Signed)
Continued from previous  noted patient noted to have multiple skin tears and bruising noted on throughout body

## 2020-12-15 NOTE — Progress Notes (Signed)
Patient received to PACU at 1708 made aware by OR staff patient received skin tear to right leg dressing in place daughter made aware at this time patient noted to have multiple

## 2020-12-15 NOTE — Anesthesia Procedure Notes (Signed)
Procedure Name: Intubation Date/Time: 12/15/2020 3:35 PM Performed by: Dorthea Cove, CRNA Pre-anesthesia Checklist: Patient identified, Emergency Drugs available, Suction available and Patient being monitored Patient Re-evaluated:Patient Re-evaluated prior to induction Oxygen Delivery Method: Circle system utilized Preoxygenation: Pre-oxygenation with 100% oxygen Induction Type: IV induction Ventilation: Mask ventilation without difficulty Laryngoscope Size: Mac and 3 Grade View: Grade I Tube type: Oral Number of attempts: 1 Airway Equipment and Method: Stylet and Oral airway Placement Confirmation: ETT inserted through vocal cords under direct vision,  positive ETCO2 and breath sounds checked- equal and bilateral Secured at: 22 cm Tube secured with: Tape Dental Injury: Teeth and Oropharynx as per pre-operative assessment

## 2020-12-15 NOTE — Progress Notes (Signed)
1805 call made to DR. Brock of low blood pressure new order received for phenylephrine started at 42ml/hr

## 2020-12-15 NOTE — Progress Notes (Signed)
Initial Nutrition Assessment  DOCUMENTATION CODES:   Severe malnutrition in context of chronic illness  INTERVENTION:    Ensure Enlive po BID, each supplement provides 350 kcal and 20 grams of protein  Magic cup BID with meals, each supplement provides 290 kcal and 9 grams of protein  MVI daily  NUTRITION DIAGNOSIS:   Severe Malnutrition related to chronic illness (COPD/memory issues) as evidenced by moderate fat depletion,severe muscle depletion,energy intake < or equal to 75% for > or equal to 1 month.  GOAL:   Patient will meet greater than or equal to 90% of their needs  MONITOR:   PO intake,Supplement acceptance,Weight trends,Diet advancement,Labs,I & O's,Skin  REASON FOR ASSESSMENT:   Malnutrition Screening Tool   ASSESSMENT:   Patient with PMH significant for HTN, osteoporosis, GERD, mitral regurgitation, CHF?, COPD?, and multiple lung nodules on CT. Currently on hospice care at [facility]. Presents this admission after mechanical fall resulting in R hip fracture.  Plan hip fracture repair this afternoon.   Patient denies decreased intake PTA. States she typically consumes three small meals daily that consist of B- coffee, english muffin L- provided by facility (meat, vegetable, grain), and D- small snack item. Does not use supplementation. Discussed the importance of protein intake to promote post op healing. Patient likes vanilla/chocolate ice cream and is willing to try Magic Cup with Ensure.   Patient denies recent weight loss and is unsure of UBW. Records indicate patient weighed 80.8 kg one year ago and show a stated weight of 72.6 kg this admission. Will need to obtain actual weight to assess for weight loss.   Medications: senokot Labs: Mg 1.5 (L) CBG 94-110  NUTRITION - FOCUSED PHYSICAL EXAM:  Flowsheet Row Most Recent Value  Orbital Region Moderate depletion  Upper Arm Region Moderate depletion  Thoracic and Lumbar Region Unable to assess  Buccal  Region Moderate depletion  Temple Region Severe depletion  Clavicle Bone Region Severe depletion  Clavicle and Acromion Bone Region Severe depletion  Scapular Bone Region Unable to assess  Dorsal Hand Severe depletion  Patellar Region Unable to assess  Anterior Thigh Region Unable to assess  Posterior Calf Region Unable to assess  Edema (RD Assessment) Mild  [bilateral lower extremities]  Hair Reviewed  Eyes Unable to assess  Mouth Unable to assess  Skin Reviewed  Nails Reviewed     Diet Order:   Diet Order            Diet NPO time specified  Diet effective midnight                 EDUCATION NEEDS:   Education needs have been addressed  Skin:  Skin Assessment: Reviewed RN Assessment (multiple skin tears)  Last BM:  3/25  Height:   Ht Readings from Last 1 Encounters:  12/14/20 5\' 1"  (1.549 m)    Weight:   Wt Readings from Last 1 Encounters:  12/14/20 72.6 kg   BMI:  Body mass index is 30.23 kg/m.  Estimated Nutritional Needs:   Kcal:  1700-1900 kcal  Protein:  85-100 grams  Fluid:  >/= 1.7 L/day   Krista Parker RD, LDN Clinical Nutrition Pager listed in [place]

## 2020-12-15 NOTE — Progress Notes (Signed)
1900 Dr. Johnney Ou made aware of heart rate 135-150's and   made aware to titrate phenyleprine

## 2020-12-15 NOTE — Addendum Note (Signed)
Addendum  created 12/15/20 1806 by Audry Pili, MD   Order list changed, Pharmacy for encounter modified

## 2020-12-15 NOTE — Transfer of Care (Signed)
Immediate Anesthesia Transfer of Care Note  Patient: Krista Parker  Procedure(s) Performed: ANTERIOR ARTHROPLASTY BIPOLAR HIP (HEMIARTHROPLASTY) (Right Hip)  Patient Location: PACU  Anesthesia Type:General  Level of Consciousness: awake, alert  and oriented  Airway & Oxygen Therapy: Patient Spontanous Breathing and Patient connected to face mask oxygen  Post-op Assessment: Report given to RN and Post -op Vital signs reviewed and stable  Post vital signs: Reviewed and stable  Last Vitals:  Vitals Value Taken Time  BP 108/62 12/15/20 1708  Temp    Pulse 92 12/15/20 1710  Resp 19 12/15/20 1710  SpO2 100 % 12/15/20 1710  Vitals shown include unvalidated device data.  Last Pain:  Vitals:   12/15/20 1418  TempSrc:   PainSc: 2       Patients Stated Pain Goal: 3 (97/67/34 1937)  Complications: No complications documented.

## 2020-12-15 NOTE — Progress Notes (Signed)
Patient reported of seeing "roaches on the ceiling at this time patient continues to be alert talkative continues with iv fluids. Family at bedside multiple skin tears and bruising on body

## 2020-12-15 NOTE — Progress Notes (Addendum)
Evening Update:   [name] is an 87 year old female with PMHx of chronic hypoxic respiratory failure on 3L oxygen, HFpEF (EF 60%, grade I DD, mitral regurgitation and aortic insufficiency), osteoporosis, hypertension, GERD, on Hospice Care admitted for displaced right femoral neck fracture s/p prosthetic replacement on 3/28. In PACU, noted to have A fib with RVR with HR 120-140's. EKG obtained and IV metoprolol 1mg  x2 given for rate control. She did have some improvement of HR to 110-120's; however, noted to develop significant hypotension 80/62, repeat 75/57 for which she was started on phenylephrine gtt.   Initial assessment 19:00:  Patient evaluated at bedside in PACU. She is resting comfortably in bed with daughter at bedside. She is awake, alert and oriented although slightly anxious. She denies any chest pain, palpitations, lightheadedness/dizziness, shortness of breath at this time. She is on 2L of oxygen at this time. HR remains elevated to 140-150s. On examination, patient is awake, alert and oriented x4. CV exam with irregularly irregular rhythm, tachycardia, no murmurs/rubs/gallops. Lungs clear to auscultation bilaterally without tachypnea.  Discussed with patient's daughter and son at bedside regarding patient needing pressure support with the phenylephrine gtt at this time and that this is considered a resuscitative effort. She is DNR on admission and in hospice care. Discussed that we goal is to get her off the vasopressors as soon as possible and avoid ICU admission. Patient and her children are agreeable with the plan.   Orders placed: - IV amiodarone bolus + infusion - Increase LR to 125 cc/hr  - Home dose of klonopin 0.5mg  and zoloft 25mg  given  - STAT CBC, BMP, Mag, Phos   Repeat assessment 20:30:  After discussion with on-call attending, patient given LR 500cc bolus and digoxin 0.25mg  to help with HR. Patient re-evaluated at bedside. She continues to rest  comfortably in bed. She denies any chest pain, dyspnea, lightheadedness/dizziness or ongoing pain at this time. Physical exam remains unchanged. HR is slightly improved to 120's but remains in A.fib on amiodarone gtt. She is continued on the phenylephrine gtt, although titrating down.  While at bedside, patient's HR improved to 80's on telemetry following digoxin administration. Patient given another bolus of LR 500cc. BP 93/60 with MAP 71. Continuing efforts to titrate off phenylephrine gtt for goal MAP >65.  Discussed with patient's family at bedside that her hypotension is likely secondary to ongoing effects of the IV metoprolol. Will continue with fluid resuscitation at this time to attempt to wean off the vasopressors.   Orders placed: - Repeat EKG - IV Bolus 500 cc x2 - Increase LR to 150 cc/hr  Repeat assessment 22:15: EKG with NSR, HR 76. Patient is drowsy but alert,oriented x4. Remains asymptomatic. Appropriately conversant with family at bedside. Titrated off of phenylephrine gtt. BP remains around 80-90/50-60 on LR 150 cc/hr. Oxygen requirement is at baseline 2L O2. She is continued on the amiodarone gtt.  Labs:  CBC with WBC 14.9>19.7, Hb 13.0>11.8, Plt 228>257 BMP Na 134, K 4.1, Cl 103, CO2 23, BUN/Cr 16/0.92, Mag 1.4  Orders: - IV Bolus 500 cc x1  - Continue LR 150 cc/hr - IV magnesium sulfate 4g x1   Discussed with RN, no progressive beds available at this time and patient is unable to return to med-surg floor with amiodarone gtt. Will have patient transferred to [place] overnight for continued close monitoring given that she is on amiodarone gtt and slightly hypotensive although maintaining MAP >65. Will continue with fluid resuscitation at this  time.  Follow up discussion with patient and her family regarding code status at bedside and future interventions. Patient and family are agreeable that if she is persistently hypotensive despite fluid resuscitation, she would not want any  pressors to help maintain her BP. Patient does not wish for any aggressive interventions at this time.   Repeat assessment 02:00:  Patient with persistent hypotension 89/50 (MAP 63), HR 60's in NSR, SpO2 99% on 2L [place] with RR 14-16. She is resting comfortably in bed without any acute concerns at this time. Also noted to be oligouric with only ~250cc UOP despite >1.5L fluid resuscitation. Although STAT labs from earlier demonstrated renal function to be at baseline, I suspect she may be developing early signs of ATN in setting of her ongoing hypotension. She has a foley catheter in place for urine output monitoring. Will bolus again for goal MAP >65.   Plan:  - IV bolus LR 500 cc x1  - Continue LR 150 cc/hr  - F/u renal function panel in AM - Strict I&O - Avoid nephrotoxic agents as able    Harvie Heck, MD Internal Medicine, PGY-2 12/16/20 2:12 AM Pager # [phone]

## 2020-12-15 NOTE — Anesthesia Preprocedure Evaluation (Addendum)
Anesthesia Evaluation  Patient identified by MRN, date of birth, ID band Patient awake and Patient confused    Reviewed: Allergy & Precautions, NPO status , Patient's Chart, lab work & pertinent test results, reviewed documented beta blocker date and time   History of Anesthesia Complications Negative for: history of anesthetic complications  Airway Mallampati: II  TM Distance: >3 FB Neck ROM: Full    Dental  (+) Edentulous Upper, Missing,    Pulmonary COPD,  COPD inhaler and oxygen dependent, former smoker,    Pulmonary exam normal        Cardiovascular hypertension, Pt. on medications and Pt. on home beta blockers Normal cardiovascular exam     Neuro/Psych negative neurological ROS  negative psych ROS   GI/Hepatic Neg liver ROS, GERD  Controlled and Medicated,  Endo/Other  negative endocrine ROS  Renal/GU negative Renal ROS  negative genitourinary   Musculoskeletal negative musculoskeletal ROS (+) Right hip fracture   Abdominal   Peds  Hematology negative hematology ROS (+)   Anesthesia Other Findings Day of surgery medications reviewed with patient.  Reproductive/Obstetrics negative OB ROS                            Anesthesia Physical Anesthesia Plan  ASA: IV and emergent  Anesthesia Plan: General   Post-op Pain Management:    Induction: Intravenous  PONV Risk Score and Plan: 3 and Treatment may vary due to age or medical condition, Ondansetron, Dexamethasone and Midazolam  Airway Management Planned: Oral ETT  Additional Equipment: None  Intra-op Plan:   Post-operative Plan: Extubation in OR  Informed Consent: I have reviewed the patients History and Physical, chart, labs and discussed the procedure including the risks, benefits and alternatives for the proposed anesthesia with the patient or authorized representative who has indicated his/her understanding and acceptance.    Patient has DNR.  Discussed DNR with patient and Continue DNR.   Dental advisory given and Consent reviewed with POA  Plan Discussed with: CRNA  Anesthesia Plan Comments:        Anesthesia Quick Evaluation

## 2020-12-15 NOTE — Consult Note (Signed)
ORTHOPAEDIC CONSULTATION  REQUESTING PHYSICIAN: Angelica Pou, MD  Chief Complaint: Right femoral neck fracture  HPI: Krista Parker is a 87 year old woman living at [facility] currently on hospice care, PCP Dr. Ola Spurr at [facility] within [place], and past medical history significant for chronic hypoxic respiratory failure (on 3L oxygen continuously at baseline), mild intermittent asthma, prior tobacco use (55-pack year smoking history), HFpEF (grade 1 diastolic dysfunction, EF of 60%, mitral regurgitation and aortic insufficiency), osteoporosis, HTN and GERD who presented to [facility] on 12/14/20 following a fall.  She attempted to get out of bed at 4 am Sunday morning.  She is minimally ambulatory at baseline with assistance. Ortho consulted for surgical evaluation.  Past Medical History:  Diagnosis Date  . Aortic insufficiency   . Asthma   . History of abdominal pain   . Hypertension   . Mitral regurgitation   . Pneumonia   . Rib pain   . Superficial basal cell carcinoma (BCC) 07/15/2016   right neck   History reviewed. No pertinent surgical history. Social History   Socioeconomic History  . Marital status: Widowed    Spouse name: Not on file  . Number of children: Not on file  . Years of education: Not on file  . Highest education level: Not on file  Occupational History  . Not on file  Tobacco Use  . Smoking status: Former Research scientist (life sciences)  . Smokeless tobacco: Never Used  Vaping Use  . Vaping Use: Not on file  Substance and Sexual Activity  . Alcohol use: Yes  . Drug use: No  . Sexual activity: Not Currently  Other Topics Concern  . Not on file  Social History Narrative  . Not on file   Social Determinants of Health   Financial Resource Strain: Not on file  Food Insecurity: Not on file  Transportation Needs: Not on file  Physical Activity: Not on file  Stress: Not on file  Social Connections: Not on file   No family  history on file. Allergies  Allergen Reactions  . Tape Other (See Comments)    SKIN IS PAPER-THIN- WILL SPLIT EASILY- PLEASE USE EASY-RELEASE TAPE, IF ANY AT ALL!!  . Cefdinir Diarrhea  . Lisinopril Cough   Prior to Admission medications   Medication Sig Start Date End Date Taking? Authorizing Provider  albuterol (VENTOLIN HFA) 108 (90 Base) MCG/ACT inhaler Inhale 2 puffs into the lungs every 6 (six) hours as needed for wheezing or shortness of breath. 12/17/19  Yes Nicole Kindred A, DO  ALPRAZolam (XANAX) 0.25 MG tablet Take 0.25 mg by mouth every 4 (four) hours as needed for anxiety.   Yes [provider]  alum & mag hydroxide-simeth (MAALOX/MYLANTA) 200-200-20 MG/5ML suspension Take 30 mLs by mouth every 4 (four) hours as needed for indigestion or heartburn. 12/17/19  Yes Nicole Kindred A, DO  calcium carbonate (TUMS EX) 750 MG chewable tablet Chew 1 tablet by mouth as needed for heartburn.   Yes [provider]  clonazePAM (KLONOPIN) 0.5 MG tablet Take 0.5 mg by mouth See admin instructions. Take 0.5 mg by mouth in the morning, at 1 PM, and at bedtime   Yes [provider]  dexamethasone (DECADRON) 4 MG tablet Take 4 mg by mouth daily with breakfast.   Yes [provider]  diphenhydrAMINE (BENADRYL) 25 mg capsule Take 25 mg by mouth daily as needed for allergies.    Yes [provider]  fentaNYL (DURAGESIC) 12 MCG/HR Place 1  patch onto the skin every 3 (three) days.   Yes [provider]  fentaNYL (DURAGESIC) 75 MCG/HR Place 1 patch onto the skin every 3 (three) days.   Yes [provider]  furosemide (LASIX) 80 MG tablet Take 40 mg by mouth in the morning.   Yes [provider]  haloperidol (HALDOL) 1 MG tablet Take 1 mg by mouth every 4 (four) hours as needed for agitation (or nausea/vomiting).   Yes [provider]  HYDROmorphone (DILAUDID) 2 MG tablet Take 2-4 mg by mouth every 2 (two) hours as needed (for  pain or shortness of breath).   Yes [provider]  loratadine (CLARITIN) 10 MG tablet Take 10 mg by mouth at bedtime as needed for allergies.   Yes [provider]  metoprolol succinate (TOPROL-XL) 50 MG 24 hr tablet Take 50 mg by mouth in the morning. Take with or immediately following a meal.   Yes [provider]  Multiple Vitamins-Minerals (ONE-A-DAY WOMENS 50+) TABS Take 1 tablet by mouth daily with breakfast.   Yes [provider]  omeprazole (PRILOSEC) 40 MG capsule Take 40 mg by mouth in the morning and at bedtime.   Yes [provider]  ondansetron (ZOFRAN-ODT) 4 MG disintegrating tablet Take 4 mg by mouth every 4 (four) hours as needed for nausea.   Yes [provider]  polyethylene glycol powder (GLYCOLAX/MIRALAX) powder Take 8.5 g by mouth at bedtime as needed for moderate constipation ([place]).   Yes [provider]  potassium chloride SA (KLOR-CON) 20 MEQ tablet Take 40 mEq by mouth in the morning.   Yes [provider]  senna (SENOKOT) 8.6 MG tablet Take 1 tablet by mouth See admin instructions. Take 1 tablet by mouth two times a day and HOLD FOR DIARRHEA   Yes [provider]  sertraline (ZOLOFT) 25 MG tablet Take 25 mg by mouth in the morning.   Yes [provider]  clotrimazole (GYNE-LOTRIMIN) 1 % vaginal cream Place 1 Applicatorful vaginally at bedtime. Patient not taking: Reported on 12/14/2020 12/17/19   Nicole Kindred A, DO  furosemide (LASIX) 40 MG tablet Take 1 tablet (40 mg total) by mouth daily as needed for edema. Patient not taking: No sig reported 12/17/19 12/16/20  Nicole Kindred A, DO  lisinopril (ZESTRIL) 10 MG tablet Take 1 tablet (10 mg total) by mouth daily. Patient not taking: Reported on 12/14/2020 12/17/19   Nicole Kindred A, DO  melatonin 5 MG TABS Take 1 tablet (5 mg total) by mouth at bedtime as needed (sleep). Patient not taking: Reported on 12/14/2020 12/17/19    Ezekiel Slocumb, DO   DG Chest 1 View  Result Date: 12/14/2020 CLINICAL DATA:  Preop for right hip fracture EXAM: CHEST  1 VIEW COMPARISON:  12/11/2019 FINDINGS: Improved aeration since prior. Residual reticulation is likely scarring or atelectasis. Vague possible nodule the right apex measuring 11 mm. Cardiomegaly. No visible effusion or pneumothorax. Distal third right clavicle fracture which was not seen previously but shows changes of suspected healing. IMPRESSION: 1. Improved aeration since 2021. Remaining reticular opacities likely reflect scarring and atelectasis. 2. Equivocal for 11 mm right apical nodule. Recommend two-view chest x-ray follow-up after fracture treatment. 3. Distal right clavicle fracture which is new from 2021 but appears nonacute. Electronically Signed   By: Monte Fantasia M.D.   On: 12/14/2020 07:44   CT Head Wo Contrast  Result Date: 12/14/2020 CLINICAL DATA:  87 year old female with fall and head  injury. EXAM: CT HEAD WITHOUT CONTRAST TECHNIQUE: Contiguous axial images were obtained from the base of the skull through the vertex without intravenous contrast. COMPARISON:  None FINDINGS: Brain: No evidence of acute infarction, hemorrhage, hydrocephalus, extra-axial collection or mass lesion/mass effect. Atrophy identified. Severe white matter hypodensities bilaterally likely represent chronic small-vessel white matter ischemic changes. Vascular: No hyperdense vessel or unexpected calcification. Skull: Normal. Negative for fracture or focal lesion. Sinuses/Orbits: No acute finding. Other: None IMPRESSION: 1. No evidence of acute intracranial abnormality. 2. Atrophy and chronic small-vessel white matter ischemic changes. Electronically Signed   By: Margarette Canada M.D.   On: 12/14/2020 08:13   DG Hip Unilat With Pelvis 2-3 Views Right  Result Date: 12/14/2020 CLINICAL DATA:  Fall with hip pain EXAM: DG HIP (WITH OR WITHOUT PELVIS) 2-3V RIGHT COMPARISON:  None. FINDINGS: Acute  right femoral neck fracture with varus angulation. No visible acute fracture through the rotated pelvis. There is a sclerotic line at the right inferior pubic ramus suggesting previous insufficiency fracture at this location. Generalized osteopenia. No notable acetabular spurring. Atheromatous calcification. IMPRESSION: Displaced right femoral neck fracture. Electronically Signed   By: Monte Fantasia M.D.   On: 12/14/2020 07:42    All pertinent xrays, MRI, CT independently reviewed and interpreted  Positive ROS: All other systems have been reviewed and were otherwise negative with the exception of those mentioned in the HPI and as above.  Physical Exam: General: No acute distress Cardiovascular: No pedal edema Respiratory: No cyanosis, no use of accessory musculature GI: No organomegaly, abdomen is soft and non-tender Skin: Multiple purpuric lesions throughout the BLEs Neurologic: Sensation intact distally Psychiatric: Patient is at baseline mood and affect Lymphatic: No axillary or cervical lymphadenopathy  MUSCULOSKELETAL:  - severe pain with movement of the hip and extremity - skin intact - NVI distally - compartments soft  Assessment: Right femoral neck fracture  Plan: - surgical treatment is recommended for pain relief, quality of life and early mobilization - patient and family are aware of r/b/a and wish to proceed, informed consent obtained - medical optimization per primary team - surgery is planned for this afternoon - continue NPO  Thank you for the consult and the opportunity to see Ms. Barbara  N. Eduard Roux, MD [facility] 9:42 AM

## 2020-12-15 NOTE — Op Note (Signed)
ANTERIOR ARTHROPLASTY BIPOLAR HIP (HEMIARTHROPLASTY)  Procedure Note Krista Parker   [id number]  Pre-op Diagnosis: RIGHT HIP FX     Post-op Diagnosis: same   Operative Procedures  1. Prosthetic replacement for femoral neck fracture. CPT [phone]  Personnel  Surgeon(s): Leandrew Koyanagi, MD  ASSIST: None   Anesthesia: general  Prosthesis: Depuy Femur: Actis 7 STD Head: 46/28 mm size: +1.5 Bearing Type: bipolar  Hip Hemiarthroplasty (Anterior Approach) Op Note:  After informed consent was obtained and the operative extremity marked in the holding area, the patient was brought back to the operating room and placed supine on the HANA table. Next, the operative extremity was prepped and draped in normal sterile fashion. Surgical timeout occurred verifying patient identification, surgical site, surgical procedure and administration of antibiotics.  A modified anterior Smith-Peterson approach to the hip was performed, using the interval between tensor fascia lata and sartorius.  Dissection was carried bluntly down onto the anterior hip capsule. The lateral femoral circumflex vessels were identified and coagulated. A capsulotomy was performed and fracture hematoma was evacuated and the capsular flaps tagged for later repair.  The neck osteotomy was performed below the fracture. The femoral head was removed and found a 46 mm head was the appropriate fit.    We then turned our attention to the femur.  After placing the femoral hook, the leg was taken to externally rotated, extended and adducted position taking care to perform soft tissue releases to allow for adequate mobilization of the femur. Soft tissue was cleared from the shoulder of the greater trochanter and the hook elevator used to improve exposure of the proximal femur. Sequential broaching performed up to a size 7. Trial neck and head were placed. The leg was brought back up to neutral and the construct reduced. The position and  sizing of components, offset and leg lengths were checked using fluoroscopy. Stability of the construct was checked in extension and external rotation without any subluxation or impingement of prosthesis. We dislocated the prosthesis, dropped the leg back into position, removed trial components, and irrigated copiously. The final stem and head was then placed, the leg brought back up, the system reduced and fluoroscopy used to verify positioning.  We irrigated, obtained hemostasis and closed the capsule using #2 ethibond suture.  The fascia was closed with #1 vicryl plus, the deep fat layer was closed with 0 vicryl, the subcutaneous layers closed with 2.0 Vicryl Plus and the skin closed with 2.0 nylon.  A sterile dressing was applied. The patient was awakened in the operating room and taken to recovery in stable condition. All sponge, needle, and instrument counts were correct at the end of the case.   Position: supine  Complications: see description of procedure.  Time Out: performed   Drains/Packing: none  Estimated blood loss: see anesthesia record  Returned to Recovery Room: in good condition.   Antibiotics: yes   Mechanical VTE (DVT) Prophylaxis: sequential compression devices, TED thigh-high  Chemical VTE (DVT) Prophylaxis: lovenox  Fluid Replacement: Crystalloid: see anesthesia record  Specimens Removed: 1 to pathology   Sponge and Instrument Count Correct? yes   PACU: portable radiograph - low AP   Admission: inpatient status, start PT & OT POD#1  Plan/RTC: Return in 2 weeks for staple removal. Return in 6 weeks to see MD.  Weight Bearing/Load Lower Extremity: full  Hip precautions: none  N. Eduard Roux, MD [facility] 4:54 PM   Implant Name Type Inv. Item Serial No. Manufacturer Lot No.  LRB No. Used Action  BIPOLAR PROS AML 46MM - [id number] Hips BIPOLAR PROS AML 46MM  [employer] [id number] Right 1 Implanted  STEM FEM ACTIS STD SZ7 - [id number] Nail STEM FEM  ACTIS STD SZ7  [employer]TH[employer] [id number] Right 1 Implanted  HIP BALL ARTICU DEPUY - [id number] Hips HIP BALL ARTICU DEPUY  [employer] [id number] Right 1 Implanted

## 2020-12-15 NOTE — Progress Notes (Signed)
68 Dr Daiva Huge at bedside Metoprolol 3mg  given total at this time continues to have heart rate 125-140 Patient denies pain at this time

## 2020-12-15 NOTE — Progress Notes (Signed)
Phenylephrine stopped at  this time Dr. Delrae Rend at bedside at this time

## 2020-12-15 NOTE — Progress Notes (Signed)
Subjective:   Overnight, no acute events.  This morning, patient denies any current hip or leg pain which she attributes to laying still in bed, although she has recently received IV dilaudid and benzodiazepines. Patient's daughter at bedside and reports that they are prepared for patient's operation today. We reviewed patient's medication list and discussed the risks associated with her multiple centrally acting medications including benzodiazepines, opioids and diphenhydramine. We discussed the importance of pain control perioperatively and the need for medication reconciliation and safe discharge planning.  Objective:  Vital signs in last 24 hours: Vitals:   12/14/20 1935 12/15/20 0000 12/15/20 0450 12/15/20 0808  BP: (!) 112/57 105/61 107/74 118/78  Pulse: 84 90 91 98  Resp: 20 20 20 16   Temp: (!) 97.5 F (36.4 C) 98.2 F (36.8 C) 97.6 F (36.4 C) (!) 97.5 F (36.4 C)  TempSrc: Oral Oral Oral Oral  SpO2: 90% 91% 90% 94%  Weight:      Height:      On home 3L oxygen via nasal cannula  Intake/Output Summary (Last 24 hours) at 12/15/2020 1341 Last data filed at 12/15/2020 1300 Gross per 24 hour  Intake 0 ml  Output 325 ml  Net -325 ml   Filed Weights   12/14/20 0616  Weight: 72.6 kg  Physical Exam Vitals and nursing note reviewed.  Constitutional:      General: She is not in acute distress.    Comments: Drowsy, confused, elderly woman supine in inclined hospital bed frequently reaching for water.  Cardiovascular:     Rate and Rhythm: Normal rate and regular rhythm.     Pulses: Normal pulses.     Heart sounds: Normal heart sounds.  Pulmonary:     Effort: Pulmonary effort is normal.     Breath sounds: Normal breath sounds.  Abdominal:     General: Abdomen is flat. Bowel sounds are normal.     Palpations: Abdomen is soft.     Tenderness: There is no abdominal tenderness.  Musculoskeletal:     Right lower leg: No edema.     Left lower leg: No edema.     Comments:  Right hip tender to palpation  Skin:    Comments: Increased skin turgor. Diffuse echymoses   Labs since admission: CK - 43 25OH vitamin D - 34.33 CBC Latest Ref Rng & Units 12/15/2020 12/14/2020 12/17/2019  WBC 4.0 - 10.5 K/uL 14.9(H) 12.8(H) 12.0(H)  Hemoglobin 12.0 - 15.0 g/dL 13.0 13.7 12.9  Hematocrit 36.0 - 46.0 % 38.3 39.8 38.8  Platelets 150 - 400 K/uL 228 266 457(H)   BMP Latest Ref Rng & Units 12/15/2020 12/14/2020 12/18/2019  Glucose 70 - 99 mg/dL 110(H) 94 -  BUN 8 - 23 mg/dL 13 10 -  Creatinine 0.44 - 1.00 mg/dL 0.96 0.82 0.61  Sodium 135 - 145 mmol/L 136 134(L) -  Potassium 3.5 - 5.1 mmol/L 3.7 3.0(L) -  Chloride 98 - 111 mmol/L 100 97(L) -  CO2 22 - 32 mmol/L 28 28 -  Calcium 8.9 - 10.3 mg/dL 8.9 9.2 -  Magnesium - 1.5  Imaging since admission: None  Assessment/Plan:  Principal Problem:   Closed displaced fracture of right femoral neck (HCC) Active Problems:   Protein-calorie malnutrition, severe  Krista Parker is a 87 year old woman living at [facility] currently on hospice care, PCP Dr. Ola Parker at [facility] within [place], and past medical history significant for chronic hypoxic respiratory failure (on 3L oxygen continuously at baseline),  mild intermittent asthma, prior tobacco use (55-pack year smoking history), HFpEF (grade 1 diastolic dysfunction, EF of 60%, mitral regurgitation and aortic insufficiency), osteoporosis, HTN and GERD who presented to [facility] on 12/14/20 following a fall found to have a displaced right femoral neck fracture.  #Displaced right femoral neck fracture, acute Patient's pain is well controlled this morning on current medication regimen. Our team had extensive discussion with daughter at bedside the importance of medication reconciliation to reduce the likelihood of recurrent falls.  -Start acetaminophen 1000mg  Q6H -Continue hydromorphone 1mg  Q4H -Continue fentanyl 34mcg Q2H PRN for severe  pain -Continue home transdermal fentanyl patch -Patient scheduled for right hemiarthroplasty today with Dr. Erlinda Parker  #Osteoporosis, chronic Patient has history of osteoporosis with fragility fractures of the spine as well as her ground-level fall this admission resulting in fracture of her right hip. Patient previously on bisphosphonate therapy, however she did not tolerate this medication due to adverse effect resulting in difficulty swallowing. -Check 25OH vitamin D level  #Anxiety, chronic Patient's home anxiety regimen likely significantly contributing to her fall risk. We will begin gentle adjustments to her regimen with consideration of her acute condition. -Discontinue home alprazolam 0.25mg  Q4H -Decrease frequency of home clonazepam from 0.5mg  three times daily to twice daily -Continue home sertraline 25mg  daily -Discontinue home diphenhydramine  #Chronic hypoxic respiratory failure on 3L oxygen via nasal cannula, chronic Patient saturating well on home oxygen requirement. -Continue home albuterol 2 puffs Q6H PRN -Continue home supplemental oxygen of 3L via nasal cannula  #HTN, chronic #HFpEF, chronic Patient's blood pressure well controlled on current regimen. Patient appears dry on examination and would benefit from gentle IV hydration in the setting of her NPO status and upcoming operation. -Continue home metoprolol 50mg  XR daily -Discontinue home furosemide 40mg  daily  #Hypokalemia, active #Hypomagnesemia, active Potassium of 3.7 and Mg of 1.5.  -Replete with 90mEq of KCl -Replete with 1g of IV magnesium sulfate -Repeat BMP and Mg in morning  #Code status: DNR #Diet: NPO, awaiting operation #Bowel regimen: Senna 8.6mg  twice daily  Miralax 8.5g daily PRN #IVF: LR at 50cc/hr  Prior to Admission Living Arrangement: Krista Parker Anticipated Discharge Location: Unknown Barriers to Discharge: Continued medical management Dispo: Anticipated discharge in approximately  2-3 day(s).   Krista Mulligan, MD 12/15/2020, 1:41 PM Pager: [phone] After 5pm on weekdays and 1pm on weekends: On Call pager [phone]

## 2020-12-15 NOTE — Anesthesia Postprocedure Evaluation (Signed)
Anesthesia Post Note  Patient: Krista Parker  Procedure(s) Performed: ANTERIOR ARTHROPLASTY BIPOLAR HIP (HEMIARTHROPLASTY) (Right Hip)     Patient location during evaluation: PACU Anesthesia Type: General Level of consciousness: awake and alert and oriented Pain management: pain level controlled Vital Signs Assessment: post-procedure vital signs reviewed and stable Respiratory status: spontaneous breathing, nonlabored ventilation and respiratory function stable Cardiovascular status: blood pressure returned to baseline and tachycardic Postop Assessment: no apparent nausea or vomiting Anesthetic complications: no Comments: New Afib with RVR rates in 140s in PACU. EKG obtained. Metoprolol IV given with improvement in rate control to 110-120s. Patient asymptomatic, awake, and without pain. Spoke to hospitalist service who will followup with patient and manage ongoing care. Daiva Huge, MD   No complications documented.  Last Vitals:  Vitals:   12/15/20 1710 12/15/20 1717  BP: 108/62   Pulse: 87 (!) 130  Resp: 19 17  Temp:    SpO2: 100% 100%    Last Pain:  Vitals:   12/15/20 1708  TempSrc:   PainSc: 0-No pain                 Brennan Bailey

## 2020-12-15 NOTE — Progress Notes (Signed)
   Krista Parker is a current active pt with [facility]. She was originally enrolled into hospice care on 02-08-20 for COPD dependent on supplemental oxygen 3 liters. HTN heart diseases with Chronic diastolic CHF, anxiety, dysphagia, GERD and age related osteoporosis w/o current pathological fractures.   She was transferred to the ED for fall with immediate pain to hip.  Her primary contact: Daughter Dareen Piano [phone]  Thank you for the help in caring for pt so that we can make her more comfortable and help provide quality of life.   This is a related covered GIP admission. I have updated billing.   Webb Silversmith RN BSN [facility] [phone]

## 2020-12-16 ENCOUNTER — Encounter (HOSPITAL_COMMUNITY): Payer: Self-pay | Admitting: Orthopaedic Surgery

## 2020-12-16 DIAGNOSIS — Z66 Do not resuscitate: Secondary | ICD-10-CM

## 2020-12-16 DIAGNOSIS — E43 Unspecified severe protein-calorie malnutrition: Secondary | ICD-10-CM

## 2020-12-16 DIAGNOSIS — I950 Idiopathic hypotension: Secondary | ICD-10-CM

## 2020-12-16 DIAGNOSIS — J9611 Chronic respiratory failure with hypoxia: Secondary | ICD-10-CM

## 2020-12-16 DIAGNOSIS — W19XXXA Unspecified fall, initial encounter: Secondary | ICD-10-CM

## 2020-12-16 DIAGNOSIS — L8990 Pressure ulcer of unspecified site, unspecified stage: Secondary | ICD-10-CM | POA: Insufficient documentation

## 2020-12-16 DIAGNOSIS — Z9981 Dependence on supplemental oxygen: Secondary | ICD-10-CM

## 2020-12-16 DIAGNOSIS — K219 Gastro-esophageal reflux disease without esophagitis: Secondary | ICD-10-CM

## 2020-12-16 DIAGNOSIS — S72001A Fracture of unspecified part of neck of right femur, initial encounter for closed fracture: Secondary | ICD-10-CM | POA: Diagnosis not present

## 2020-12-16 DIAGNOSIS — I4891 Unspecified atrial fibrillation: Secondary | ICD-10-CM | POA: Diagnosis not present

## 2020-12-16 LAB — RENAL FUNCTION PANEL
Albumin: 2.3 g/dL — ABNORMAL LOW (ref 3.5–5.0)
Anion gap: 7 (ref 5–15)
BUN: 13 mg/dL (ref 8–23)
CO2: 25 mmol/L (ref 22–32)
Calcium: 8 mg/dL — ABNORMAL LOW (ref 8.9–10.3)
Chloride: 101 mmol/L (ref 98–111)
Creatinine, Ser: 0.83 mg/dL (ref 0.44–1.00)
GFR, Estimated: >60 mL/min (ref >60)
Glucose, Bld: 133 mg/dL — ABNORMAL HIGH (ref 70–99)
Phosphorus: 3.7 mg/dL (ref 2.5–4.6)
Potassium: 3.6 mmol/L (ref 3.5–5.1)
Sodium: 133 mmol/L — ABNORMAL LOW (ref 135–145)

## 2020-12-16 LAB — CBC
HCT: 30.8 % — ABNORMAL LOW (ref 36.0–46.0)
Hemoglobin: 10.4 g/dL — ABNORMAL LOW (ref 12.0–15.0)
MCH: 29.1 pg (ref 26.0–34.0)
MCHC: 33.8 g/dL (ref 30.0–36.0)
MCV: 86.3 fL (ref 80.0–100.0)
Platelets: 199 10*3/uL (ref 150–400)
RBC: 3.57 MIL/uL — ABNORMAL LOW (ref 3.87–5.11)
RDW: 15.8 % — ABNORMAL HIGH (ref 11.5–15.5)
WBC: 13.5 10*3/uL — ABNORMAL HIGH (ref 4.0–10.5)
nRBC: 0 % (ref 0.0–0.2)

## 2020-12-16 LAB — MAGNESIUM: Magnesium: 2.1 mg/dL (ref 1.7–2.4)

## 2020-12-16 MED ORDER — METOPROLOL SUCCINATE ER 50 MG PO TB24
50.0000 mg | ORAL_TABLET | Freq: Every day | ORAL | Status: DC
  Administered 2020-12-16: 50 mg via ORAL
  Filled 2020-12-16: qty 1

## 2020-12-16 NOTE — Discharge Summary (Signed)
Name: Krista Parker MRN: [id number] DOB: 05-17-1932 87 y.o. PCP: Leonel Ramsay, MD  Date of Admission: 12/14/2020  6:10 AM Date of Discharge: 12/19/2020 Attending Physician: Velna Ochs, MD  Discharge Diagnosis: 1. Principal Problem:   Closed displaced fracture of right femoral neck (HCC) Active Problems:   Protein-calorie malnutrition, severe   Pressure injury of skin  Discharge Medications: Allergies as of 12/19/2020      Reactions   Tape Other (See Comments)   SKIN IS PAPER-THIN- WILL SPLIT EASILY- PLEASE USE EASY-RELEASE TAPE, IF ANY AT ALL!!   Cefdinir Diarrhea   Lisinopril Cough      Medication List    STOP taking these medications   ALPRAZolam 0.25 MG tablet Commonly known as: XANAX   alum & mag hydroxide-simeth 500-938-18 MG/5ML suspension Commonly known as: MAALOX/MYLANTA   clonazePAM 0.5 MG tablet Commonly known as: KLONOPIN Replaced by: clonazePAM 0.25 MG disintegrating tablet   clotrimazole 1 % vaginal cream Commonly known as: GYNE-LOTRIMIN   dexamethasone 4 MG tablet Commonly known as: DECADRON   diphenhydrAMINE 25 mg capsule Commonly known as: BENADRYL   fentaNYL 12 MCG/HR Commonly known as: DURAGESIC   fentaNYL 75 MCG/HR Commonly known as: DURAGESIC   furosemide 40 MG tablet Commonly known as: Lasix   furosemide 80 MG tablet Commonly known as: LASIX   haloperidol 1 MG tablet Commonly known as: HALDOL   HYDROmorphone 2 MG tablet Commonly known as: DILAUDID   lisinopril 10 MG tablet Commonly known as: ZESTRIL   loratadine 10 MG tablet Commonly known as: CLARITIN   melatonin 5 MG Tabs   ondansetron 4 MG disintegrating tablet Commonly known as: ZOFRAN-ODT   polyethylene glycol powder 17 GM/SCOOP powder Commonly known as: GLYCOLAX/MIRALAX Replaced by: polyethylene glycol 17 g packet   potassium chloride SA 20 MEQ tablet Commonly known as: KLOR-CON     TAKE these medications   acetaminophen 500 MG  tablet Commonly known as: TYLENOL Take 2 tablets (1,000 mg total) by mouth every 6 (six) hours.   albuterol 108 (90 Base) MCG/ACT inhaler Commonly known as: VENTOLIN HFA Inhale 2 puffs into the lungs every 6 (six) hours as needed for wheezing or shortness of breath.   calcium carbonate 750 MG chewable tablet Commonly known as: TUMS EX Chew 1 tablet by mouth as needed for heartburn.   clonazePAM 0.25 MG disintegrating tablet Commonly known as: KLONOPIN Take 1 tablet (0.25 mg total) by mouth 3 (three) times daily. Replaces: clonazePAM 0.5 MG tablet   enoxaparin 40 MG/0.4ML injection Commonly known as: LOVENOX Inject 0.4 mLs (40 mg total) into the skin daily for 14 days.   metoprolol succinate 25 MG 24 hr tablet Commonly known as: TOPROL-XL Take 1 tablet (25 mg total) by mouth daily. Start taking on: December 20, 2020 What changed:   medication strength  how much to take  when to take this  additional instructions   omeprazole 40 MG capsule Commonly known as: PRILOSEC Take 40 mg by mouth in the morning and at bedtime.   One-A-Day Womens 50+ Tabs Take 1 tablet by mouth daily with breakfast.   polyethylene glycol 17 g packet Commonly known as: MIRALAX / GLYCOLAX Take 17 g by mouth daily. Start taking on: December 20, 2020 Replaces: polyethylene glycol powder 17 GM/SCOOP powder   senna 8.6 MG Tabs tablet Commonly known as: SENOKOT Take 1 tablet (8.6 mg total) by mouth daily. Start taking on: December 20, 2020 What changed:   when to take this  additional instructions  sertraline 50 MG tablet Commonly known as: ZOLOFT Take 1 tablet (50 mg total) by mouth in the morning. Start taking on: December 20, 2020 What changed:   medication strength  how much to take            Discharge Care Instructions  (From admission, onward)         Start     Ordered   12/19/20 0000  Discharge wound care:       Comments: For ALL skin tears on extremities, place Xeroform Kellie Simmering  [phone]) over the skin tear and secure with a few turns of kerlex. Change daily. Moisten old dressing with saline if stuck to the wound.   12/19/20 1452   12/15/20 0000  Weight bearing as tolerated        12/15/20 1513         Disposition and follow-up:   Krista Parker was discharged from [facility] in Stable condition.  At the hospital follow up visit please address:  1.  Displaced right femoral neck fracture status post right hip arthroplasty: Please assess patient's progress with working with therapy at her skilled nursing facility. Assess whether scheduled acetaminophen 1,000mg  four times daily is appropriate for pain control as it has been during hospitalization. Patient to follow-up with ortho in 2 weeks.  Anxiety: Patient discharged on sertraline 50mg  daily and clonazepam 0.25mg  three times daily. Assess response to treatment and consider further dosage adjustments favoring non-benzodiazepine options given her recurrent falls.  Hypertension: Patient discharged on metoprolol 25mg  daily alone, please assess appropriateness of current regimen.  HFpEF: Patient's home furosemide discontinued as patient significantly dry on admission and did not develop evidence of volume overload despite holding diuretics and IVF administration throughout hospitalization. Assess volume status.  2.  Labs / imaging needed at time of follow-up: CBC, BMP  3.  Pending labs/ test needing follow-up: None  Follow-up Appointments:  Follow-up Information    Leandrew Koyanagi, MD In 2 weeks.   Specialty: Orthopedic Surgery Why: For suture removal Contact information: [place] [address] [phone]        Rehab, [place]. Go to.   Specialty: [place] information: [address] [place] [address] [phone]        Leonel Ramsay, MD. Schedule an appointment as soon as possible for a visit in 1 week(s).    Specialty: Infectious Diseases Contact information: [place] [address] [place] Hospital Course by problem list:  #Displaced right femoral neck fracture Patient presented following a fall at her assisted living facility with significant pain to her right hip. Patient had radiograph of the pelvis which revealed a displaced right femoral neck fracture. Orthopedics was consulted and patient underwent right hip arthroplasty well with no intraoperative complications. Her pain was well controlled with scheduled acetaminophen 1000mg  Q6H. Patient scheduled for follow-up in two weeks with Dr. Erlinda Hong. Patient evaluated by PT and OT with recommendation for SNF placement. Patient's family provided multiple SNF options and selected discharge to [facility] with revocation of hospice.  #New onset paroxysmal atrial fibrillation Following patient's operation, she developed new onset atrial fibrillation. She was medically managed with IV metoprolol, amiodarone bolus and infusion, and one dose of digoxin, with concversion to normal sinus rhythm with rate of 60s-70s within several hours. Patient restarted on home metoprolol at a reduced dose of 25mg  daily  without recurrence of her atrial fibrillation.  #Anxiety Patient's home anxiety regimen modified in the setting of her recurrent falls prior to arrival. Patient's home alprazolam was discontinued and home clonazepam dosage reduced to 0.25mg  three times daily.  #Chronic hypoxic respiratory failure on 3L oxygen via nasal cannula Patient's home oxygen requirement titrated down throughout hospitalization to 1-2L.  #GERD Patient continued on pantoprazole and calcium carbonate during hospitalization.  Pertinent Labs, Studies, and Procedures:  CBC Latest Ref Rng & Units 12/19/2020 12/18/2020 12/17/2020  WBC 4.0 - 10.5 K/uL 8.9 8.5 9.5  Hemoglobin 12.0 - 15.0 g/dL 10.0(L) 9.6(L) 9.5(L)  Hematocrit 36.0 - 46.0 % 29.9(L)  29.5(L) 29.1(L)  Platelets 150 - 400 K/uL 249 180 189   CMP Latest Ref Rng & Units 12/19/2020 12/18/2020 12/17/2020  Glucose 70 - 99 mg/dL 86 85 85  BUN 8 - 23 mg/dL 9 11 11   Creatinine 0.44 - 1.00 mg/dL 0.63 0.74 0.95  Sodium 135 - 145 mmol/L 133(L) 133(L) 132(L)  Potassium 3.5 - 5.1 mmol/L 3.4(L) 3.9 3.6  Chloride 98 - 111 mmol/L 102 103 98  CO2 22 - 32 mmol/L 25 25 28   Calcium 8.9 - 10.3 mg/dL 8.1(L) 7.9(L) 8.3(L)  Total Protein 6.5 - 8.1 g/dL - - -  Total Bilirubin 0.3 - 1.2 mg/dL - - -  Alkaline Phos 38 - 126 U/L - - -  AST 15 - 41 U/L - - -  ALT 0 - 44 U/L - - -  Pelvis Portable  Result Date: 12/15/2020 CLINICAL DATA:  Postop right hip replacement. EXAM: PORTABLE PELVIS 1-2 VIEWS COMPARISON:  Preoperative radiographs yesterday. FINDINGS: Right hip arthroplasty in expected alignment. No periprosthetic lucency or fracture. Recent postsurgical change includes air and edema joint space and soft tissues. IMPRESSION: Right hip arthroplasty without immediate postoperative complication. Electronically Signed   By: Keith Rake M.D.   On: 12/15/2020 18:05   DG C-Arm 1-60 Min  Result Date: 12/15/2020 CLINICAL DATA:  Right hip arthroplasty. EXAM: OPERATIVE RIGHT HIP (WITH PELVIS IF PERFORMED) TECHNIQUE: Fluoroscopic spot image(s) were submitted for interpretation post-operatively. COMPARISON:  Preoperative radiograph yesterday. FINDINGS: Four fluoroscopic spot views obtained in the operating room of the pelvis and right hip. Interval right hip arthroplasty. Total fluoroscopy time 6 seconds. Total dose 0.75 mGy. IMPRESSION: Procedural fluoroscopy during right hip arthroplasty. Electronically Signed   By: Keith Rake M.D.   On: 12/15/2020 18:03   DG HIP OPERATIVE UNILAT WITH PELVIS RIGHT  Result Date: 12/15/2020 CLINICAL DATA:  Right hip arthroplasty. EXAM: OPERATIVE RIGHT HIP (WITH PELVIS IF PERFORMED) TECHNIQUE: Fluoroscopic spot image(s) were submitted for interpretation post-operatively.  COMPARISON:  Preoperative radiograph yesterday. FINDINGS: Four fluoroscopic spot views obtained in the operating room of the pelvis and right hip. Interval right hip arthroplasty. Total fluoroscopy time 6 seconds. Total dose 0.75 mGy. IMPRESSION: Procedural fluoroscopy during right hip arthroplasty. Electronically Signed   By: Keith Rake M.D.   On: 12/15/2020 18:03   Discharge Instructions: Discharge Instructions    Call MD for:  difficulty breathing, headache or visual disturbances   Complete by: As directed    Call MD for:  extreme fatigue   Complete by: As directed    Call MD for:  hives   Complete by: As directed    Call MD for:  persistant dizziness or light-headedness   Complete by: As directed    Call MD for:  persistant nausea and vomiting   Complete by: As directed    Call MD for:  redness,  tenderness, or signs of infection (pain, swelling, redness, odor or green/yellow discharge around incision site)   Complete by: As directed    Call MD for:  severe uncontrolled pain   Complete by: As directed    Call MD for:  temperature >100.4   Complete by: As directed    Diet - low sodium heart healthy   Complete by: As directed    Discharge instructions   Complete by: As directed    Krista Parker,  I am glad that you are doing so well following your recent hip fracture and repair. We recommend discharging to a skilled nursing facility for continued rehabilitation. We have made some modifications to your home medication regimen. It will be very important that you follow up closely with your doctors after leaving the hospital to continue working on optimizing your medication regimen.  Sincerely, Dr. Paulla Dolly, MD   Discharge wound care:   Complete by: As directed    For ALL skin tears on extremities, place Xeroform Kellie Simmering [phone]) over the skin tear and secure with a few turns of kerlex. Change daily. Moisten old dressing with saline if stuck to the wound.   Increase  activity slowly   Complete by: As directed    Weight bearing as tolerated   Complete by: As directed      Signed: Cato Mulligan, MD 12/19/2020, 2:53 PM   Pager: [phone]

## 2020-12-16 NOTE — Progress Notes (Signed)
Subjective: 1 Day Post-Op Procedure(s) (LRB): ANTERIOR ARTHROPLASTY BIPOLAR HIP (HEMIARTHROPLASTY) (Right) Patient reports pain as mild.    Objective: Vital signs in last 24 hours: Temp:  [97.4 F (36.3 C)-98.6 F (37 C)] 97.7 F (36.5 C) (03/29 0800) Pulse Rate:  [39-144] 80 (03/29 0800) Resp:  [13-38] 16 (03/29 0800) BP: (71-122)/(40-80) 113/61 (03/29 0800) SpO2:  [69 %-100 %] 92 % (03/29 0800)  Intake/Output from previous day: 03/28 0701 - 03/29 0700 In: 2524.8 [I.V.:2228.7; IV Piggyback:296.1] Out: 500 [Urine:400; Blood:100] Intake/Output this shift: Total I/O In: 718.5 [P.O.:120; I.V.:498.5; IV Piggyback:100] Out: -   Recent Labs    12/14/20 0709 12/15/20 0837 12/15/20 2121 12/16/20 0047  HGB 13.7 13.0 11.8* 10.4*   Recent Labs    12/15/20 2121 12/16/20 0047  WBC 19.7* 13.5*  RBC 4.03 3.57*  HCT 35.1* 30.8*  PLT 257 199   Recent Labs    12/15/20 2121 12/16/20 0047  NA 134* 133*  K 4.1 3.6  CL 103 101  CO2 23 25  BUN 16 13  CREATININE 0.92 0.83  GLUCOSE 140* 133*  CALCIUM 8.2* 8.0*   Recent Labs    12/14/20 0709  INR 1.1    Neurologically intact Neurovascular intact Sensation intact distally Intact pulses distally Dorsiflexion/Plantar flexion intact Incision: dressing C/D/I No cellulitis present Compartment soft   Assessment/Plan: 1 Day Post-Op Procedure(s) (LRB): ANTERIOR ARTHROPLASTY BIPOLAR HIP (HEMIARTHROPLASTY) (Right) Up with therapy WBAT RLE ABLA- mild and stable Lovenox and scds for dvt ppx Fu with Dr. Erlinda Hong in two weeks    Aundra Dubin 12/16/2020, 8:57 AM

## 2020-12-16 NOTE — Evaluation (Signed)
Occupational Therapy Evaluation Patient Details Name: Krista Parker MRN: [id number] DOB: Apr 22, 1932 Today's Date: 12/16/2020    History of Present Illness Krista Parker is a 87 y/o female who presented to ED following a fall at [facility], where she is under hospice care. DG of hip revealed displaced R femoral neck fracture and pt was admitted on 12/14/20. Underwent Rt anterior THA on 3/28. Post op pt with Afib with RVR and hypotension. PMHx: end-stage COPD, CHF, respiratory failure on 3L, and superifical BCC.   Clinical Impression   For the past 6 months Pt has been getting assist for bathing/dressing - but able to complete grooming and self-feeding, she gets around primarily by wheelchair and does require assist for transfers. Today The patient is mod A + 2 for bed mobility, min A +2 for initial stand and mod A +2 face to face for pivot to recliner, RLE buckling. Pt is max to total A for LB ADL at this time, mod A for UB ADL, set up for grooming/self-feeding. Pt will require SNF post-acute to rehab new hip sx - this should be done in conjunction with hospice services. OT will continue to follow acutely with focus on transfers, and ADL.    Follow Up Recommendations  SNF (coordinating with Hospice Services)    Equipment Recommendations  Other (comment) (defer to SNF)    Recommendations for Other Services       Precautions / Restrictions Precautions Precautions: None Restrictions Weight Bearing Restrictions: Yes RLE Weight Bearing: Weight bearing as tolerated      Mobility Bed Mobility Overal bed mobility: Needs Assistance Bed Mobility: Supine to Sit     Supine to sit: Max assist;+2 for physical assistance;+2 for safety/equipment;HOB elevated     General bed mobility comments: vc for sequencing, assist for BLE, trunk elevation, use of pad to bring hips EOB    Transfers Overall transfer level: Needs assistance Equipment used: 2 person hand held  assist Transfers: Sit to/from [facility] Sit to Stand: Min assist;+2 physical assistance;+2 safety/equipment;From elevated surface Stand pivot transfers: Mod assist;+2 physical assistance;+2 safety/equipment       General transfer comment: Pt fatigues quickly with transfer, RLE buckling requiring increased assist mod  A+2 for pivot to recliner with step by step sequencing and cues    Balance Overall balance assessment: Needs assistance Sitting-balance support: Bilateral upper extremity supported;Feet supported Sitting balance-Leahy Scale: Fair Sitting balance - Comments: progressed to min guard seated balance from mod A Postural control: Posterior lean Standing balance support: Bilateral upper extremity supported Standing balance-Leahy Scale: Poor Standing balance comment: dependent on BUE support, fatigues quickly                           ADL either performed or assessed with clinical judgement   ADL Overall ADL's : Needs assistance/impaired Eating/Feeding: Set up;Sitting   Grooming: Set up;Sitting   Upper Body Bathing: Moderate assistance;Sitting   Lower Body Bathing: Maximal assistance;Sitting/lateral leans Lower Body Bathing Details (indicate cue type and reason): assist for the knees down Upper Body Dressing : Moderate assistance;Sitting   Lower Body Dressing: Total assistance;Bed level   Toilet Transfer: Moderate assistance;+2 for physical assistance;+2 for safety/equipment;Stand-pivot Toilet Transfer Details (indicate cue type and reason): face to face transfer Watson and Hygiene: Total assistance       Functional mobility during ADLs: Moderate assistance;+2 for physical assistance;+2 for safety/equipment (face to face, SPT only)       Vision  Patient Visual Report: No change from baseline       Perception     Praxis      Pertinent Vitals/Pain Pain Assessment: Faces Faces Pain Scale: Hurts little  more Pain Location: R hip/leg with WB Pain Descriptors / Indicators: Discomfort;Grimacing;Operative site guarding Pain Intervention(s): Monitored during session;Repositioned;Limited activity within patient's tolerance     Hand Dominance Right   Extremity/Trunk Assessment Upper Extremity Assessment Upper Extremity Assessment: Generalized weakness (lots of bandages/bruising)   Lower Extremity Assessment Lower Extremity Assessment: RLE deficits/detail RLE Deficits / Details: post-op deficits as anticipated RLE Sensation: WNL RLE Coordination: decreased gross motor;decreased fine motor   Cervical / Trunk Assessment Cervical / Trunk Assessment: Kyphotic   Communication Communication Communication: No difficulties   Cognition Arousal/Alertness: Awake/alert Behavior During Therapy: WFL for tasks assessed/performed Overall Cognitive Status: Impaired/Different from baseline Area of Impairment: Memory;Problem solving;Orientation;Following commands;Safety/judgement                 Orientation Level: Disoriented to;Place;Situation (forgot where she was, and which type of sx she'd had)   Memory: Decreased short-term memory Following Commands: Follows one step commands with increased time Safety/Judgement: Decreased awareness of deficits   Problem Solving: Decreased initiation;Requires verbal cues General Comments: Pt very pleasant, communicative, slightly confused, increased time and cues throughout session unreliable historian - children there to confirm.   General Comments  Daughter and son present in room throughout session    Exercises     Shoulder Instructions      Home Living Family/patient expects to be discharged to:: Skilled nursing facility                                 Additional Comments: Had been at [facility] working with [employer]      Prior Functioning/Environment Level of Independence: Needs assistance  Gait / Transfers  Assistance Needed: transfers to [facility] with assist for the last 6 months ADL's / Homemaking Assistance Needed: assist from staff for the last 6 months for bathing/dressing, self feeds and grooms   Comments: working with hospice services        OT Problem List: Decreased strength;Decreased activity tolerance;Impaired balance (sitting and/or standing);Decreased range of motion;Decreased knowledge of use of DME or AE;Pain      OT Treatment/Interventions: Self-care/ADL training;Therapeutic exercise;DME and/or AE instruction;Manual therapy;Therapeutic activities;Cognitive remediation/compensation;Patient/family education;Balance training    OT Goals(Current goals can be found in the care plan section) Acute Rehab OT Goals Patient Stated Goal: "be out of here by Friday" OT Goal Formulation: With patient/family Time For Goal Achievement: 12/30/20 Potential to Achieve Goals: Fair ADL Goals Pt Will Perform Grooming: with modified independence;sitting Pt Will Perform Upper Body Dressing: with set-up;sitting Pt Will Perform Lower Body Dressing: with mod assist;sit to/from stand Pt Will Transfer to Toilet: with mod assist;stand pivot transfer;bedside commode Pt Will Perform Toileting - Clothing Manipulation and hygiene: with max assist;sit to/from stand Additional ADL Goal #1: Pt will perform bed mobility at min  A prior to engaging in seated ADL  OT Frequency: Min 2X/week   Barriers to D/C:            Co-evaluation PT/OT/SLP Co-Evaluation/Treatment: Yes Reason for Co-Treatment: For patient/therapist safety;To address functional/ADL transfers PT goals addressed during session: Mobility/safety with mobility;Balance;Strengthening/ROM OT goals addressed during session: ADL's and self-care;Strengthening/ROM      AM-PAC OT "6 Clicks" Daily Activity     Outcome Measure Help from another person eating meals?:  A Little Help from another person taking care of personal grooming?: A Little Help from  another person toileting, which includes using toliet, bedpan, or urinal?: A Lot Help from another person bathing (including washing, rinsing, drying)?: A Lot Help from another person to put on and taking off regular upper body clothing?: A Lot Help from another person to put on and taking off regular lower body clothing?: Total 6 Click Score: 13   End of Session Equipment Utilized During Treatment: Gait belt;Oxygen (3L (baseline)) Nurse Communication: Mobility status;Precautions;Weight bearing status  Activity Tolerance: Patient tolerated treatment well Patient left: in chair;with call bell/phone within reach;with chair alarm set;with family/visitor present  OT Visit Diagnosis: Unsteadiness on feet (R26.81);Other abnormalities of gait and mobility (R26.89);History of falling (Z91.81);Muscle weakness (generalized) (M62.81);Other symptoms and signs involving cognitive function;Pain Pain - Right/Left: Right Pain - part of body: Hip;Leg                Time: 3335-4562 OT Time Calculation (min): 28 min Charges:  OT General Charges $OT Visit: 1 Visit OT Evaluation $OT Eval Moderate Complexity: [place] OTR/L Acute Rehabilitation Services Pager: [phone] Office: [place] 12/16/2020, 9:27 AM

## 2020-12-16 NOTE — Evaluation (Signed)
Physical Therapy Evaluation Patient Details Name: Krista Parker MRN: [id number] DOB: 20-Jun-1932 Today's Date: 12/16/2020   History of Present Illness  Krista Parker is a 87 y/o female who presented to ED following a fall at [place], where she is under hospice care. DG of hip revealed displaced R femoral neck fracture and pt was admitted on 12/14/20. Underwent Rt anterior THA on 3/28. Post op pt with Afib with RVR and hypotension. PMHx: end-stage COPD, CHF, respiratory failure on 3L, and superifical BCC.  Clinical Impression  Pt pleasant with family visiting in room. Pt unaware of surgery performed and able to assist with limited bed mobility then +2 assist to pivot to chair. Pt requires max +2 assist for all mobility and currently does not have this level of assist at [facility]. Discussed with family current recommendations and that social work/hospice could further assist with recommendations. Pt with decreased strength, balance, transfers and function who will benefit from acute therapy to maximize mobility, safety and decrease burden of care.   93-98% on 3LHR 72-80 96/83 (89) in chair    Follow Up Recommendations SNF;Supervision/Assistance - 24 hour    Equipment Recommendations  None recommended by PT    Recommendations for Other Services       Precautions / Restrictions Precautions Precautions: Fall Restrictions Weight Bearing Restrictions: Yes RLE Weight Bearing: Weight bearing as tolerated      Mobility  Bed Mobility Overal bed mobility: Needs Assistance Bed Mobility: Supine to Sit     Supine to sit: Max assist;+2 for physical assistance;+2 for safety/equipment;HOB elevated     General bed mobility comments: vc for sequencing, assist for BLE, trunk elevation, use of pad to bring hips EOB    Transfers Overall transfer level: Needs assistance Equipment used: 2 person hand held assist Transfers: Sit to/from [facility] Sit to  Stand: Min assist;+2 physical assistance;+2 safety/equipment;From elevated surface Stand pivot transfers: Mod assist;+2 physical assistance;+2 safety/equipment       General transfer comment: cues for hand placement with pt grasping therapist arms to stand. Pt fatigues quickly with transfer, RLE buckling requiring increased assist mod  A+2 for pivot to recliner with step by step sequencing and cues  Ambulation/Gait             General Gait Details: did not attempt  Stairs            Wheelchair Mobility    Modified Rankin (Stroke Patients Only)       Balance Overall balance assessment: History of Falls Sitting-balance support: Bilateral upper extremity supported;Feet supported Sitting balance-Leahy Scale: Fair Sitting balance - Comments: progressed to min guard seated balance from mod A Postural control: Posterior lean Standing balance support: Bilateral upper extremity supported Standing balance-Leahy Scale: Poor Standing balance comment: dependent on BUE support, fatigues quickly                             Pertinent Vitals/Pain Pain Assessment: Faces Faces Pain Scale: Hurts little more Pain Location: R hip/leg with WB Pain Descriptors / Indicators: Discomfort;Grimacing;Operative site guarding Pain Intervention(s): Limited activity within patient's tolerance;Monitored during session;Repositioned    Home Living Family/patient expects to be discharged to:: [place]: Wheelchair - manual;Other (comment);Walker - 2 wheels (lift chair) Additional Comments: Had been at [employer] working with [facility]    Prior Function Level of Independence:  Needs assistance   Gait / Transfers Assistance Needed: transfers to [facility] with assist for the last 6 months at times was transferring on her own which led to the fall  ADL's / Homemaking Assistance Needed: assist from staff for the last 6 months for  bathing/dressing, self feeds and grooms  Comments: working with hospice services     Hand Dominance   Dominant Hand: Right    Extremity/Trunk Assessment   Upper Extremity Assessment Upper Extremity Assessment: Defer to OT evaluation    Lower Extremity Assessment Lower Extremity Assessment: Generalized weakness RLE Deficits / Details: soreness with hip flexion but able to activate quads and hamstring well RLE Sensation: WNL RLE Coordination:     Cervical / Trunk Assessment Cervical / Trunk Assessment: Kyphotic  Communication   Communication: No difficulties  Cognition Arousal/Alertness: Awake/alert Behavior During Therapy: WFL for tasks assessed/performed Overall Cognitive Status: Impaired/Different from baseline Area of Impairment: Memory;Problem solving;Orientation;Following commands;Safety/judgement                 Orientation Level: Disoriented to;Place;Situation   Memory: Decreased short-term memory Following Commands: Follows one step commands with increased time Safety/Judgement: Decreased awareness of deficits   Problem Solving: Decreased initiation;Requires verbal cues General Comments: Pt very pleasant, communicative, slightly confused, increased time and cues throughout session unreliable historian - children there to confirm.      General Comments General comments (skin integrity, edema, etc.): Daughter and son present in room throughout session    Exercises General Exercises - Lower Extremity Long Arc Quad: AROM;Both;Seated;10 reps   Assessment/Plan    PT Assessment Patient needs continued PT services  PT Problem List Decreased strength;Decreased mobility;Decreased safety awareness;Decreased activity tolerance;Decreased balance;Decreased knowledge of use of DME;Decreased cognition       PT Treatment Interventions Gait training;Functional mobility training;Therapeutic activities;Patient/family education;Cognitive remediation;Balance  training;DME instruction;Therapeutic exercise    PT Goals (Current goals can be found in the Care Plan section)  Acute Rehab PT Goals Patient Stated Goal: "be out of here by Friday", play cards PT Goal Formulation: With patient/family Time For Goal Achievement: 12/30/20 Potential to Achieve Goals: Fair    Frequency Min 3X/week   Barriers to discharge Decreased caregiver support      Co-evaluation PT/OT/SLP Co-Evaluation/Treatment: Yes Reason for Co-Treatment: For patient/therapist safety;To address functional/ADL transfers PT goals addressed during session: Mobility/safety with mobility;Balance;Strengthening/ROM OT goals addressed during session: ADL's and self-care;Strengthening/ROM       AM-PAC PT "6 Clicks" Mobility  Outcome Measure Help needed turning from your back to your side while in a flat bed without using bedrails?: A Lot Help needed moving from lying on your back to sitting on the side of a flat bed without using bedrails?: Total Help needed moving to and from a bed to a chair (including a wheelchair)?: Total Help needed standing up from a chair using your arms (e.g., wheelchair or bedside chair)?: A Little Help needed to walk in hospital room?: Total Help needed climbing 3-5 steps with a railing? : Total 6 Click Score: 9    End of Session Equipment Utilized During Treatment: Gait belt Activity Tolerance: Patient tolerated treatment well Patient left: in chair;with call bell/phone within reach;with chair alarm set;with family/visitor present Nurse Communication: Mobility status PT Visit Diagnosis: Difficulty in walking, not elsewhere classified (R26.2);Other abnormalities of gait and mobility (R26.89);Muscle weakness (generalized) (M62.81)    Time: 5009-3818 PT Time Calculation (min) (ACUTE ONLY): 23 min   Charges:   PT Evaluation $PT Eval Moderate Complexity: 1 Mod  [place] Pager: [phone] Office:  [place] 12/16/2020, 10:35 AM

## 2020-12-16 NOTE — Progress Notes (Signed)
Subjective:   Following operation, patient noted to have new onset atrial fibrillation with RVR up to 150s and received IV metoprolol x2. Patient became progressively hypotensive with MAP <65 and started on phenylephrine drip and received aggressive IV fluid hydration with boluses and continuous fluids (approximately 2.5L in total). Patient started on amiodarone drip and received one dose of digoxin 0.25mg  with conversion to normal sinus rhythm with rate in 60s-70s. Patient's phenylephrine drip was titrated off, however patient transferred to 2 Heart for continued supervision as she was unable to transfer to Med-Surg due to ongoing amiodarone drip.  This morning, patient reports that she feels well and denies any complaints. She states that she had an operation yesterday on her eye. We reminded her that she had a hip operation, not an eye operation, to which she responds, "oh yeah that too." She was able to report to our team that today is Tuesday. She described to our team the origin of her last name. Her husband is polish, she is [place]. Her maiden name was Reegan. She has no complaints or concerns.  Objective:  Vital signs in last 24 hours: Vitals:   12/16/20 0900 12/16/20 1000 12/16/20 1100 12/16/20 1200  BP:  (!) 122/56  111/84  Pulse: 80 77 77 (!) 104  Resp: (!) 21 (!) 22 20 (!) 23  Temp:      TempSrc:      SpO2: 96% 92% 95% 92%  Weight:      Height:      On home 3L oxygen via nasal cannula  Intake/Output Summary (Last 24 hours) at 12/16/2020 1314 Last data filed at 12/16/2020 1200 Gross per 24 hour  Intake 3874.75 ml  Output 300 ml  Net 3574.75 ml   Filed Weights   12/14/20 0616  Weight: 72.6 kg   Physical Exam Vitals and nursing note reviewed.  Constitutional:      Comments: Very pleasant, elderly woman supine in upright hospital bed, in no acute distress, however periodically confused during conversation  Eyes:     Extraocular Movements: Extraocular movements intact.      Conjunctiva/sclera: Conjunctivae normal.  Cardiovascular:     Rate and Rhythm: Normal rate and regular rhythm.     Pulses: Normal pulses.  Pulmonary:     Effort: Pulmonary effort is normal. No respiratory distress.     Breath sounds: Normal breath sounds.  Abdominal:     General: Abdomen is flat. Bowel sounds are normal.     Palpations: Abdomen is soft.     Tenderness: There is no abdominal tenderness.  Musculoskeletal:     Right lower leg: No edema.     Left lower leg: No edema.  Skin:    Capillary Refill: Capillary refill takes more than 3 seconds.     Comments: Bilateral feet cold to touch, warmer than yesterday. Cyanotic toes. Varicose veins of bilateral lower extremities. Scattered ecchymoses of extremities  Neurological:     Mental Status: She is disoriented.    Labs in last 24 hours:  CBC Latest Ref Rng & Units 12/16/2020 12/15/2020 12/15/2020  WBC 4.0 - 10.5 K/uL 13.5(H) 19.7(H) 14.9(H)  Hemoglobin 12.0 - 15.0 g/dL 10.4(L) 11.8(L) 13.0  Hematocrit 36.0 - 46.0 % 30.8(L) 35.1(L) 38.3  Platelets 150 - 400 K/uL 199 257 228   BMP Latest Ref Rng & Units 12/16/2020 12/15/2020 12/15/2020  Glucose 70 - 99 mg/dL 133(H) 140(H) 110(H)  BUN 8 - 23 mg/dL 13 16 13   Creatinine 0.44 - 1.00 mg/dL  0.83 0.92 0.96  Sodium 135 - 145 mmol/L 133(L) 134(L) 136  Potassium 3.5 - 5.1 mmol/L 3.6 4.1 3.7  Chloride 98 - 111 mmol/L 101 103 100  CO2 22 - 32 mmol/L 25 23 28   Calcium 8.9 - 10.3 mg/dL 8.0(L) 8.2(L) 8.9  Magnesium - 2.1  Imaging in last 24 hours: Pelvis Portable Result Date: 12/15/2020 IMPRESSION: Right hip arthroplasty without immediate postoperative complication.  Assessment/Plan:  Principal Problem:   Closed displaced fracture of right femoral neck (HCC) Active Problems:   Protein-calorie malnutrition, severe   Pressure injury of skin  Krista Parker is a 87 year old woman living at [facility] currently on hospice care with past medical history significant for  chronic hypoxic respiratory failure (on 3L oxygen), HFpEF (G1DD, mitral regurgitation and aortic insufficiency), osteoporosis and HTN who presented to [facility] on 12/14/20 following a fall found to have a displaced right femoral neck fracture now status post right hip arthroplasty.  #New onset atrial fibrillation, resolved Patient developed new onset atrial fibrillation with RVR following her operation. Patient medically managed overnight and following IV metoprolol, amiodarone bolus and infusion, and one dose of digoxin, patient has converted to normal sinus rhythm with rate of 60s-70s. -Discontinue amiodarone infusion -Discontinue digoxin -Restart home metoprolol 50mg  ER daily -BMP and Mg in morning  #Hypotension, resolved Following patient's operation, she received IV metoprolol x2 for new onset atrial fibrillation with RVR. Patient received aggressive IVF resuscitation and IV phenylephrine with improvement of her hypotension with MAP most recently >65. Concern for possible acute tubular necrosis due to minimal urine output despite aggressive fluid resuscitation, although recent BMP shows improving creatinine. -Discontinued phenylephrine -Continue LR at 125cc/hr -Bolus PRN -Restart home metoprolol 50mg  ER daily -BMP in morning  #Displaced right femoral neck fracture, acute Patient underwent right hip arthroplasty well with no intraoperative complications. Her pain is well controlled with minimal analgesics following her operation. We will continue to manage patient's pain with attempts at minimizing centrally acting medications to only as needed given her risk of repeated falls and confusion. -Continue acetaminophen 1000mg  Q6H -Start hydrocodone-acetaminophen 5-325mg  1-2 tablets Q4H PRN moderate pain -Start hydrocodone-acetaminophen 7.5-325mg  1-2 tablets Q4H PRN for severe pain -Methocarbamol 500mg  Q6H PRN for muscle spasms -PT/OT evaluation today, up with therapy -Weight bearing as  tolerated -Follow-up with Dr. Erlinda Hong in two weeks -CBC in morning  #Anxiety, chronic Patient's anxiety well controlled this morning. -Continue reduced frequency of home clonazepam 0.5mg  twice daily, may reduce further -Continue home sertraline 25mg  daily  #Chronic hypoxic respiratory failure on 3L oxygen via nasal cannula, chronic Patient stable on 2L, down from home oxygen requirement of 3L -Continue home albuterol 2 puffs Q6H PRN -Continue home supplemental oxygen to maintain SpO2 >92%  #GERD, chronic -Continue pantoprazole 40mg  daily -Continue calcium carbonate 500mg  three times daily  PRN  #Code status: DNR #Diet: Heart healthy #Bowel regimen: Senna 8.6mg  twice daily  Miralax 17g daily PRN  Magnesium citrate PRN severe constipation #IVF: LR at 125cc/hr  Prior to Admission Living Arrangement: [facility] Anticipated Discharge Location: Unknown Barriers to Discharge: Continued medical management Dispo: Anticipated discharge in approximately 2-3 day(s).   Cato Mulligan, MD 12/16/2020, 1:14 PM Pager: [phone] After 5pm on weekdays and 1pm on weekends: On Call pager [phone]

## 2020-12-16 NOTE — Plan of Care (Signed)

## 2020-12-17 LAB — BASIC METABOLIC PANEL
Anion gap: 6 (ref 5–15)
BUN: 11 mg/dL (ref 8–23)
CO2: 28 mmol/L (ref 22–32)
Calcium: 8.3 mg/dL — ABNORMAL LOW (ref 8.9–10.3)
Chloride: 98 mmol/L (ref 98–111)
Creatinine, Ser: 0.95 mg/dL (ref 0.44–1.00)
GFR, Estimated: 58 mL/min — ABNORMAL LOW (ref >60)
Glucose, Bld: 85 mg/dL (ref 70–99)
Potassium: 3.6 mmol/L (ref 3.5–5.1)
Sodium: 132 mmol/L — ABNORMAL LOW (ref 135–145)

## 2020-12-17 LAB — CBC
HCT: 29.1 % — ABNORMAL LOW (ref 36.0–46.0)
Hemoglobin: 9.5 g/dL — ABNORMAL LOW (ref 12.0–15.0)
MCH: 28.7 pg (ref 26.0–34.0)
MCHC: 32.6 g/dL (ref 30.0–36.0)
MCV: 87.9 fL (ref 80.0–100.0)
Platelets: 189 10*3/uL (ref 150–400)
RBC: 3.31 MIL/uL — ABNORMAL LOW (ref 3.87–5.11)
RDW: 15.8 % — ABNORMAL HIGH (ref 11.5–15.5)
WBC: 9.5 10*3/uL (ref 4.0–10.5)
nRBC: 0 % (ref 0.0–0.2)

## 2020-12-17 LAB — MAGNESIUM: Magnesium: 2 mg/dL (ref 1.7–2.4)

## 2020-12-17 MED ORDER — HYDROCODONE-ACETAMINOPHEN 5-325 MG PO TABS: 1.0000 | ORAL_TABLET | ORAL | Status: DC | PRN

## 2020-12-17 MED ORDER — CLONAZEPAM 0.5 MG PO TABS
0.5000 mg | ORAL_TABLET | Freq: Two times a day (BID) | ORAL | Status: DC | PRN
Start: 2020-12-17 — End: 2020-12-17

## 2020-12-17 MED ORDER — CLONAZEPAM 0.25 MG PO TBDP
0.2500 mg | ORAL_TABLET | Freq: Three times a day (TID) | ORAL | Status: DC | PRN
Start: 2020-12-17 — End: 2020-12-17
  Filled 2020-12-17: qty 1

## 2020-12-17 MED ORDER — LACTATED RINGERS IV BOLUS
1000.0000 mL | Freq: Once | INTRAVENOUS | Status: AC
  Administered 2020-12-17: 1000 mL via INTRAVENOUS

## 2020-12-17 MED ORDER — LORAZEPAM 0.5 MG PO TABS
0.5000 mg | ORAL_TABLET | Freq: Three times a day (TID) | ORAL | Status: DC | PRN
  Administered 2020-12-18 – 2020-12-19 (×4): 0.5 mg via ORAL
  Filled 2020-12-17 (×4): qty 1

## 2020-12-17 MED ORDER — SERTRALINE HCL 25 MG PO TABS
50.0000 mg | ORAL_TABLET | Freq: Every morning | ORAL | Status: DC
  Administered 2020-12-18 – 2020-12-19 (×2): 50 mg via ORAL
  Filled 2020-12-17 (×2): qty 2

## 2020-12-17 MED ORDER — METOPROLOL SUCCINATE ER 25 MG PO TB24
12.5000 mg | ORAL_TABLET | Freq: Every day | ORAL | Status: DC
  Administered 2020-12-17 – 2020-12-18 (×2): 12.5 mg via ORAL
  Filled 2020-12-17 (×2): qty 1

## 2020-12-17 MED ORDER — POLYETHYLENE GLYCOL 3350 17 G PO PACK
17.0000 g | PACK | Freq: Every day | ORAL | Status: DC
  Administered 2020-12-17 – 2020-12-18 (×2): 17 g via ORAL
  Filled 2020-12-17 (×2): qty 1

## 2020-12-17 MED ORDER — POTASSIUM CHLORIDE CRYS ER 20 MEQ PO TBCR
20.0000 meq | EXTENDED_RELEASE_TABLET | Freq: Once | ORAL | Status: AC
  Administered 2020-12-17: 20 meq via ORAL
  Filled 2020-12-17: qty 1

## 2020-12-17 MED ORDER — SENNA 8.6 MG PO TABS
2.0000 | ORAL_TABLET | Freq: Two times a day (BID) | ORAL | Status: DC
  Administered 2020-12-17 – 2020-12-18 (×2): 17.2 mg via ORAL
  Filled 2020-12-17 (×3): qty 2

## 2020-12-17 NOTE — Care Management Important Message (Signed)
Important Message  Patient Details  Name: Krista Parker MRN: [id number] Date of Birth: 11-Jun-1932   Medicare Important Message Given:  Yes     Kehinde Totzke 12/17/2020, 1:48 PM

## 2020-12-17 NOTE — Progress Notes (Addendum)
Subjective:   Overnight, no acute events.  This morning, patient reports that she feels well and denies any new complaints or concerns. She states that her right hip is not bothering her while laying in bed, however she reports that it hurts after moving around. She is hopeful to get out of bed today to work with our therapists. She enjoyed her session yesterday. She has no further requests of our team.  Objective:  Vital signs in last 24 hours: Vitals:   12/17/20 0400 12/17/20 0525 12/17/20 0837 12/17/20 1137  BP: (!) 104/56  117/60 103/66  Pulse: 64     Resp: 17 19 19 18   Temp:   (!) 97.5 F (36.4 C) 97.7 F (36.5 C)  TempSrc:   Oral Oral  SpO2: 93%  91% 94%  Weight:      Height:      On 3L oxygen via nasal cannula  Intake/Output Summary (Last 24 hours) at 12/17/2020 1426 Last data filed at 12/17/2020 1300 Gross per 24 hour  Intake 1081.11 ml  Output 350 ml  Net 731.11 ml   Physical Exam Constitutional:      Comments: Elderly woman sitting upright in hospital bed in no acute distress, engaging in conversation although speech is somewhat disorganized and tangential  Cardiovascular:     Rate and Rhythm: Normal rate and regular rhythm.     Pulses: Normal pulses.  Pulmonary:     Effort: Pulmonary effort is normal. No respiratory distress.     Breath sounds: Normal breath sounds.  Abdominal:     General: Abdomen is flat. Bowel sounds are normal.     Palpations: Abdomen is soft.     Tenderness: There is no abdominal tenderness.  Musculoskeletal:     Right lower leg: No edema.     Left lower leg: No edema.  Skin:    Capillary Refill: Capillary refill takes less than 2 seconds.  Neurological:     Comments: Oriented to self, location and reason for hospitalization. Reports the date is Thursday and the year is 2021   Labs in last 24 hours: CBC Latest Ref Rng & Units 12/17/2020 12/16/2020 12/15/2020  WBC 4.0 - 10.5 K/uL 9.5 13.5(H) 19.7(H)  Hemoglobin 12.0 - 15.0 g/dL  9.5(L) 10.4(L) 11.8(L)  Hematocrit 36.0 - 46.0 % 29.1(L) 30.8(L) 35.1(L)  Platelets 150 - 400 K/uL 189 199 257   BMP Latest Ref Rng & Units 12/17/2020 12/16/2020 12/15/2020  Glucose 70 - 99 mg/dL 85 133(H) 140(H)  BUN 8 - 23 mg/dL 11 13 16   Creatinine 0.44 - 1.00 mg/dL 0.95 0.83 0.92  Sodium 135 - 145 mmol/L 132(L) 133(L) 134(L)  Potassium 3.5 - 5.1 mmol/L 3.6 3.6 4.1  Chloride 98 - 111 mmol/L 98 101 103  CO2 22 - 32 mmol/L 28 25 23   Calcium 8.9 - 10.3 mg/dL 8.3(L) 8.0(L) 8.2(L)  Magnesium - 2.0  Imaging in last 24 hours: No results found.  Assessment/Plan:  Principal Problem:   Closed displaced fracture of right femoral neck (HCC) Active Problems:   Protein-calorie malnutrition, severe   Pressure injury of skin  Krista Parker is a 87 year old woman living at [facility] currently on hospice care with past medical history significant for chronic hypoxic respiratory failure (on 3L oxygen), HFpEF (G1DD, mitral regurgitation and aortic insufficiency), osteoporosis and HTN who presented to [facility] on 12/14/20 following a fall found to have a displaced right femoral neck fracture now status post right hip arthroplasty.  #Displaced right femoral neck fracture  status post right hip arthroplasty Patient recovering well from her recent operation. Patient evaluated by physical therapy and occupational therapy yesterday with recommendation for SNF placement. Patient's family requesting palliative care consultation to explore options for patient as she was previously living at assisted living facility with hospice services. -Palliative care consulted, appreciate guidance for disposition planning -Continue acetaminophen 1000mg  Q6H -Continue hydrocodone-acetaminophen 5-325mg  Q4H PRN -Continue PT/OT, appreciate recommendations -Weight bearing as tolerated -Follow-up with Dr. Erlinda Hong in two weeks  #Anxiety, chronic Patient's anxiety well controlled with continued simplification of her home  regimen. Prior to admission patient on scheduled clonazepam 0.5mg  three times daily, alprazolam 0.25mg  every four hours PRN and diphenhydramine PRN which very likely contributed to her fall. -Discontinue clonazepam -Start lorazepam 0.5mg  three times daily PRN -Increase home sertraline to 50mg  daily  #Hypertension, chronic Patient's blood pressure relatively soft following her recent operation. Patient has had minimal PO intake surrounding her fracture.  -1L bolus of LR -Decrease metoprolol ER from 50mg  daily to 12.5mg  daily  #Chronic hypoxic respiratory failure on 3L oxygen via nasal cannula, chronic Patient stable on home oxygen requirement of 3L. -Continue home albuterol 2 puffs Q6H PRN -Continue home supplemental oxygen to maintain SpO2 >92%  #GERD, chronic -Continue pantoprazole 40mg  daily -Continue calcium carbonate 500mg  three times daily  PRN  #Code status: DNR #Diet: Heart healthy #Bowel regimen: Senna 17.4mg  twice daily  Miralax 17g daily  Magnesium citrate PRN severe constipation #IVF: 1L bolus of LR #VTE ppx: Enoxaparin 40mg  daily  Prior to Admission Living Arrangement: [place] Anticipated Discharge Location: Pending Barriers to Discharge: Continued medical management Dispo: Anticipated discharge in approximately 0-1 day(s).   Cato Mulligan, MD 12/17/2020, 2:26 PM Pager: [phone] After 5pm on weekdays and 1pm on weekends: On Call pager [phone]

## 2020-12-17 NOTE — Progress Notes (Signed)
Subjective:  2 Days Post-Op Procedure(s) (LRB): ANTERIOR ARTHROPLASTY BIPOLAR HIP (HEMIARTHROPLASTY) (Right) Patient reports pain as mild.    Objective: Vital signs in last 24 hours: Temp:  [97.7 F (36.5 C)-97.9 F (36.6 C)] 97.7 F (36.5 C) (03/30 0300) Pulse Rate:  [57-104] 64 (03/30 0400) Resp:  [14-25] 19 (03/30 0525) BP: (90-122)/(46-86) 104/56 (03/30 0400) SpO2:  [84 %-99 %] 93 % (03/30 0400)  Intake/Output from previous day: 03/29 0701 - 03/30 0700 In: 1680.5 [P.O.:360; I.V.:1120.7; IV Piggyback:199.9] Out: 475 [Urine:475] Intake/Output this shift: No intake/output data recorded.  Recent Labs    12/15/20 0837 12/15/20 2121 12/16/20 0047 12/17/20 0153  HGB 13.0 11.8* 10.4* 9.5*   Recent Labs    12/16/20 0047 12/17/20 0153  WBC 13.5* 9.5  RBC 3.57* 3.31*  HCT 30.8* 29.1*  PLT 199 189   Recent Labs    12/16/20 0047 12/17/20 0153  NA 133* 132*  K 3.6 3.6  CL 101 98  CO2 25 28  BUN 13 11  CREATININE 0.83 0.95  GLUCOSE 133* 85  CALCIUM 8.0* 8.3*   No results for input(s): LABPT, INR in the last 72 hours.  Neurologically intact Neurovascular intact Sensation intact distally Intact pulses distally Dorsiflexion/Plantar flexion intact Incision: dressing C/D/I No cellulitis present Compartment soft   Assessment/Plan: 2 Days Post-Op Procedure(s) (LRB): ANTERIOR ARTHROPLASTY BIPOLAR HIP (HEMIARTHROPLASTY) (Right) Up with therapy WBAT RLE ABLA- mild and stable Lovenox and scds for dvt ppx Fu with Dr. Erlinda Hong in two weeks     Aundra Dubin 12/17/2020, 8:26 AM

## 2020-12-18 DIAGNOSIS — R531 Weakness: Secondary | ICD-10-CM | POA: Diagnosis not present

## 2020-12-18 DIAGNOSIS — R54 Age-related physical debility: Secondary | ICD-10-CM

## 2020-12-18 DIAGNOSIS — S72001A Fracture of unspecified part of neck of right femur, initial encounter for closed fracture: Secondary | ICD-10-CM | POA: Diagnosis not present

## 2020-12-18 DIAGNOSIS — Z515 Encounter for palliative care: Secondary | ICD-10-CM

## 2020-12-18 LAB — CBC
HCT: 29.5 % — ABNORMAL LOW (ref 36.0–46.0)
Hemoglobin: 9.6 g/dL — ABNORMAL LOW (ref 12.0–15.0)
MCH: 28.5 pg (ref 26.0–34.0)
MCHC: 32.5 g/dL (ref 30.0–36.0)
MCV: 87.5 fL (ref 80.0–100.0)
Platelets: 180 10*3/uL (ref 150–400)
RBC: 3.37 MIL/uL — ABNORMAL LOW (ref 3.87–5.11)
RDW: 15.6 % — ABNORMAL HIGH (ref 11.5–15.5)
WBC: 8.5 10*3/uL (ref 4.0–10.5)
nRBC: 0 % (ref 0.0–0.2)

## 2020-12-18 LAB — BASIC METABOLIC PANEL
Anion gap: 5 (ref 5–15)
BUN: 11 mg/dL (ref 8–23)
CO2: 25 mmol/L (ref 22–32)
Calcium: 7.9 mg/dL — ABNORMAL LOW (ref 8.9–10.3)
Chloride: 103 mmol/L (ref 98–111)
Creatinine, Ser: 0.74 mg/dL (ref 0.44–1.00)
GFR, Estimated: >60 mL/min (ref >60)
Glucose, Bld: 85 mg/dL (ref 70–99)
Potassium: 3.9 mmol/L (ref 3.5–5.1)
Sodium: 133 mmol/L — ABNORMAL LOW (ref 135–145)

## 2020-12-18 LAB — MAGNESIUM: Magnesium: 1.8 mg/dL (ref 1.7–2.4)

## 2020-12-18 MED ORDER — PANTOPRAZOLE SODIUM 20 MG PO TBEC
20.0000 mg | DELAYED_RELEASE_TABLET | Freq: Every day | ORAL | Status: DC
  Administered 2020-12-19: 20 mg via ORAL
  Filled 2020-12-18: qty 1

## 2020-12-18 MED ORDER — SENNA 8.6 MG PO TABS
1.0000 | ORAL_TABLET | Freq: Two times a day (BID) | ORAL | Status: DC
  Filled 2020-12-18: qty 1

## 2020-12-18 NOTE — Progress Notes (Signed)
Physical Therapy Treatment Patient Details Name: Krista Parker MRN: [id number] DOB: 06/05/1932 Today's Date: 12/18/2020    History of Present Illness Krista Parker is a 87 y/o female who presented to ED following a fall at [facility], where she is under hospice care. DG of hip revealed displaced R femoral neck fracture and pt was admitted on 12/14/20. Underwent Rt anterior THA on 3/28. Post op pt with Afib with RVR and hypotension. PMHx: end-stage COPD, CHF, respiratory failure on 3L, and superifical BCC.    PT Comments    Pt received in supine, agreeable to therapy session and motivated to progress mobility, with good tolerance for transfer and pre-gait training. Pt making good progress toward functional mobility goals, AM-PAC "6 clicks" score improved 3 points from previous session. Pt performed supine/seated/standing RLE A/AAROM therapeutic exercises as detailed below with good tolerance, given supine HEP handout and encouraged to perform TID within pain-free range as able. Pt performed multiple transfers and pre-gait standing tasks at bedside with +23min to modA, needing greatly increased time/cues for lateral stepping toward HOB, with posterior lean at RW. She was able to demonstrate standing from recliner chair to [place] with +64min/modA and RN notified to use Denna Haggard for totalA to pivot from chair>bed. Pt continues to benefit from PT services to progress toward functional mobility goals. Continue to recommend SNF.  Follow Up Recommendations  SNF;Supervision/Assistance - 24 hour     Equipment Recommendations  None recommended by PT (defer to next location)    Recommendations for Other Services       Precautions / Restrictions Precautions Precautions: Fall Restrictions Weight Bearing Restrictions: Yes RLE Weight Bearing: Weight bearing as tolerated    Mobility  Bed Mobility Overal bed mobility: Needs Assistance Bed Mobility: Supine to Sit     Supine to sit: +2  for physical assistance;+2 for safety/equipment;HOB elevated;Min assist;Mod assist     General bed mobility comments: vc for sequencing, min assist for BLE, minA for trunk elevation with use of rail and cues for self-assist, use of pad to bring hips EOB/weight shifting to foot flat needing/modA transfer pad assist    Transfers Overall transfer level: Needs assistance Equipment used: Rolling walker (2 wheeled) Transfers: Sit to/from Stand Sit to Stand: Min assist;+2 physical assistance;+2 safety/equipment;From elevated surface;Mod assist Stand pivot transfers: Total assist (with Stedy)       General transfer comment: Pt fatigues quickly with transfer but able to stand x3 reps from EOB (1 rep to RW and 2 reps to stedy and x1 rep from recliner chair to [place])  Ambulation/Gait             General Gait Details: deferred stand pivot transfer for safety but she was able to take 4 lateral steps toward [facility] with +32mod to maxA and increased time at [facility] (Stroke Patients Only)       Balance Overall balance assessment: Needs assistance Sitting-balance support: Bilateral upper extremity supported;Feet supported Sitting balance-Leahy Scale: Fair Sitting balance - Comments: progressed to min guard seated balance from mod A Postural control: Posterior lean Standing balance support: Bilateral upper extremity supported Standing balance-Leahy Scale: Poor Standing balance comment: dependent on BUE support and external assist fatigues quickly                            Cognition Arousal/Alertness: Awake/alert Behavior  During Therapy: WFL for tasks assessed/performed Overall Cognitive Status: Impaired/Different from baseline Area of Impairment: Memory;Problem solving;Orientation;Following commands;Safety/judgement                 Orientation Level: Disoriented to;Place (reminded her of hospital name)    Memory: Decreased short-term memory Following Commands: Follows one step commands with increased time Safety/Judgement: Decreased awareness of deficits   Problem Solving: Decreased initiation;Requires verbal cues;Slow processing;Difficulty sequencing General Comments: Pt very pleasant, communicative, slightly confused, increased time and cues throughout session unreliable historian. Very motivated, good participation.      Exercises General Exercises - Lower Extremity Ankle Circles/Pumps: AROM;Both;10 reps;Supine Long Arc Quad: AROM;Both;Seated;10 reps Heel Slides: AAROM;Right;10 reps;Supine (limited ROM 2/2 pain) Hip ABduction/ADduction: AAROM;Right;10 reps;Supine (limited ROM within tolerance) Hip Flexion/Marching: AROM;Both;5 reps;Standing (at [facility])    General Comments General comments (skin integrity, edema, etc.): multiple areas of skin breakdown with many foam pads over BLE and BUE, some redness noted to mid-back once seated EOB and pt remained up in chair at end of session, did review pressure relief strategies with her; pt reports severe RUE skin pain with BP cuff and bruising apparent to her upper arm, RN notified obtained small cuff for wrist BP measurement. BP 107/60 taken supine/prior to mobility and BP 134/77 taken at R wrist post-activity seated in chair      Pertinent Vitals/Pain Pain Assessment: Faces Faces Pain Scale: Hurts even more Pain Location: R hip/leg with WB Pain Descriptors / Indicators: Discomfort;Grimacing;Operative site guarding;Moaning Pain Intervention(s): Monitored during session;Repositioned;Patient requesting pain meds-RN notified           PT Goals (current goals can now be found in the care plan section) Acute Rehab PT Goals Patient Stated Goal: "I want to work with therapy every day as much as I can." PT Goal Formulation: With patient/family Time For Goal Achievement: 12/30/20 Potential to Achieve Goals: Fair Progress towards PT goals:  Progressing toward goals    Frequency    Min 3X/week      PT Plan Current plan remains appropriate       AM-PAC PT "6 Clicks" Mobility   Outcome Measure  Help needed turning from your back to your side while in a flat bed without using bedrails?: A Little Help needed moving from lying on your back to sitting on the side of a flat bed without using bedrails?: A Lot Help needed moving to and from a bed to a chair (including a wheelchair)?: A Lot Help needed standing up from a chair using your arms (e.g., wheelchair or bedside chair)?: A Little Help needed to walk in hospital room?: Total Help needed climbing 3-5 steps with a railing? : Total 6 Click Score: 12    End of Session Equipment Utilized During Treatment: Gait belt Activity Tolerance: Patient tolerated treatment well Patient left: in chair;with call bell/phone within reach;with chair alarm set;with nursing/sitter in room Nurse Communication: Mobility status;Need for lift equipment;Patient requests pain meds (use Stedy for safety) PT Visit Diagnosis: Difficulty in walking, not elsewhere classified (R26.2);Other abnormalities of gait and mobility (R26.89);Muscle weakness (generalized) (M62.81)     Time: 1510-1600 PT Time Calculation (min) (ACUTE ONLY): 50 min  Charges:  $Therapeutic Exercise: 23-37 mins $Therapeutic Activity: 8-22 mins                     Delray Reza P., PTA Acute Rehabilitation Services Pager: [phone] Office: [place] 12/18/2020, 4:39 PM

## 2020-12-18 NOTE — NC FL2 (Signed)
[place] LEVEL OF CARE SCREENING TOOL     IDENTIFICATION  Patient Name: Zyona Head Birthdate: 06-Aug-1932 Sex: female Admission Date (Current Location): 12/14/2020  [facility] and [place] Number:  Herbalist and Address:  [facility] [place], [place] [address], [place], [place] [address]      Provider Number:    Attending Physician Name and Address:  Angelica Pou, MD  Relative Name and Phone Number:       Current Level of Care: Hospital Recommended Level of Care:   Prior Approval Number:    Date Approved/Denied:   PASRR Number: [id number]  Discharge Plan: SNF    Current Diagnoses: Patient Active Problem List   Diagnosis Date Noted  . Pressure injury of skin 12/16/2020  . Protein-calorie malnutrition, severe 12/15/2020  . Closed displaced fracture of right femoral neck ([place]) 12/14/2020  . Acute respiratory failure with hypoxia ([place]) 12/12/2019  . Aortic insufficiency   . Mitral regurgitation   . COPD with acute exacerbation ([place]) 12/11/2019  . CAP (community acquired pneumonia) 12/11/2019  . Essential hypertension 12/11/2019  . Hypoxia 12/11/2019  . Hypertensive urgency 12/11/2019  . Hyponatremia 12/11/2019  . Abnormal ECG 02/13/2014  . Murmur 02/13/2014  . History of abdominal pain   . Rib pain     Orientation RESPIRATION BLADDER Height & Weight     Self,Time,Situation,Place  O2 (3L) Incontinent (foley) Weight: 162 lb 0.6 oz (73.5 kg) Height:  5\' 1"  (154.9 cm)  BEHAVIORAL SYMPTOMS/MOOD NEUROLOGICAL BOWEL NUTRITION STATUS      Incontinent Diet (See discharge summary)  AMBULATORY STATUS COMMUNICATION OF NEEDS Skin   Extensive Assist Verbally PU Stage and Appropriate Care PU Stage 1 Dressing: BID                     Personal Care Assistance Level of Assistance  Bathing,Feeding,Dressing Bathing Assistance: Maximum assistance Feeding assistance: Limited assistance       Functional Limitations  Info  Hearing,Sight,Speech Sight Info: Adequate Hearing Info: Adequate Speech Info: Adequate    SPECIAL CARE FACTORS FREQUENCY  PT (By licensed PT),OT (By licensed OT)     PT Frequency: 5x a week OT Frequency: 5x a week            Contractures Contractures Info: Not present    Additional Factors Info  Code Status,Allergies,Insulin Sliding Scale Code Status Info: DNR Allergies Info: Tape   Cefdinir   Lisinopril   Insulin Sliding Scale Info: lovonox 40mg  1x a day       Current Medications (12/18/2020):  This is the current hospital active medication list Current Facility-Administered Medications  Medication Dose Route Frequency Provider Last Rate Last Admin  . acetaminophen (TYLENOL) tablet 1,000 mg  1,000 mg Oral Q6H Leandrew Koyanagi, MD   1,000 mg at 12/18/20 1553  . albuterol (VENTOLIN HFA) 108 (90 Base) MCG/ACT inhaler 2 puff  2 puff Inhalation Q6H PRN Leandrew Koyanagi, MD      . calcium carbonate (TUMS - dosed in mg elemental calcium) chewable tablet 500 mg  500 mg Oral TID PRN Leandrew Koyanagi, MD   500 mg at 12/17/20 0910  . enoxaparin (LOVENOX) injection 40 mg  40 mg Subcutaneous Q24H Leandrew Koyanagi, MD   40 mg at 12/18/20 1553  . feeding supplement (ENSURE ENLIVE / ENSURE PLUS) liquid 237 mL  237 mL Oral TID BM Leandrew Koyanagi, MD   237 mL at 12/17/20 2030  . HYDROcodone-acetaminophen (  NORCO/VICODIN) 5-325 MG per tablet 1 tablet  1 tablet Oral Q4H PRN Cato Mulligan, MD      . LORazepam (ATIVAN) tablet 0.5 mg  0.5 mg Oral TID PRN Cato Mulligan, MD   0.5 mg at 12/18/20 1023  . metoprolol succinate (TOPROL-XL) 24 hr tablet 12.5 mg  12.5 mg Oral Daily Cato Mulligan, MD   12.5 mg at 12/18/20 1006  . multivitamin with minerals tablet 1 tablet  1 tablet Oral Daily Leandrew Koyanagi, MD   1 tablet at 12/18/20 1008  . mupirocin ointment (BACTROBAN) 2 % 1 application  1 application Nasal BID Leandrew Koyanagi, MD   1 application at 69/62/95 1011  . ondansetron (ZOFRAN) tablet 4 mg  4 mg  Oral Q6H PRN Leandrew Koyanagi, MD       Or  . ondansetron [facility]) injection 4 mg  4 mg Intravenous Q6H PRN Leandrew Koyanagi, MD      . Derrill Memo ON 12/19/2020] pantoprazole (PROTONIX) EC tablet 20 mg  20 mg Oral Daily Cato Mulligan, MD      . polyethylene glycol (MIRALAX / GLYCOLAX) packet 17 g  17 g Oral Daily Madalyn Rob, MD   17 g at 12/18/20 1006  . senna (SENOKOT) tablet 17.2 mg  2 tablet Oral BID Cato Mulligan, MD   17.2 mg at 12/18/20 1008  . sertraline (ZOLOFT) tablet 50 mg  50 mg Oral q AM Cato Mulligan, MD   50 mg at 12/18/20 0802     Discharge Medications: Please see discharge summary for a list of discharge medications.  Relevant Imaging Results:  Relevant Lab Results:   Additional Information SSN:[id number], covid vaccinated  Emeterio Reeve, [place]

## 2020-12-18 NOTE — TOC Initial Note (Addendum)
Transition of Care [facility]) - Initial/Assessment Note    Patient Details  Name: Krista Parker MRN: [id number] Date of Birth: [birth year]  Transition of Care [facility]) CM/SW Contact:    Krista Parker Phone Number: 12/18/2020, 4:03 PM  Clinical Narrative:                 2:30pm- CSW spoke with pt she asked to let het children make the decision for SNF or to return to ALF. 3:50pm- CSW spoke with pt daughter Krista Parker) she and her brother are not set on sending their mother to a SNF but is trying to weigh their options. They want to see what facilities will accept their mother first because they will have to discontinue [place] if they decided to persue a SNF. Daughter asked if after tomorrow the brother can be the main contact (Parker,Krista [phone]).            Patient Goals and CMS Choice        Expected Discharge Plan and Services                                                Prior Living Arrangements/Services                       Activities of Daily Living Home Assistive Devices/Equipment: Oxygen ADL Screening (condition at time of admission) Patient's cognitive ability adequate to safely complete daily activities?: Yes Is the patient deaf or have difficulty hearing?: No Does the patient have difficulty seeing, even when wearing glasses/contacts?: No Does the patient have difficulty concentrating, remembering, or making decisions?: No Patient able to express need for assistance with ADLs?: Yes Does the patient have difficulty dressing or bathing?: Yes Independently performs ADLs?: No Communication: Independent Dressing (OT): Needs assistance Is this a change from baseline?: Pre-admission baseline Grooming: Needs assistance Is this a change from baseline?: Pre-admission baseline Feeding: Needs assistance Is this a change from baseline?: Pre-admission baseline Bathing: Needs assistance Is this a change from  baseline?: Pre-admission baseline Toileting: Needs assistance Is this a change from baseline?: Pre-admission baseline In/Out Bed: Needs assistance Is this a change from baseline?: Pre-admission baseline Walks in Home: Needs assistance Is this a change from baseline?: Pre-admission baseline Does the patient have difficulty walking or climbing stairs?: Yes Weakness of Legs: Both Weakness of Arms/Hands: Both  Permission Sought/Granted                  Emotional Assessment              Admission diagnosis:  Hypokalemia [E87.6] Closed right hip fracture (HCC) [S72.001A] Closed fracture of right hip, initial encounter ([place]) [S72.001A] Patient Active Problem List   Diagnosis Date Noted  . Pressure injury of skin 12/16/2020  . Protein-calorie malnutrition, severe 12/15/2020  . Closed displaced fracture of right femoral neck ([place]) 12/14/2020  . Acute respiratory failure with hypoxia ([place]) 12/12/2019  . Aortic insufficiency   . Mitral regurgitation   . COPD with acute exacerbation ([place]) 12/11/2019  . CAP (community acquired pneumonia) 12/11/2019  . Essential hypertension 12/11/2019  . Hypoxia 12/11/2019  . Hypertensive urgency 12/11/2019  . Hyponatremia 12/11/2019  . Abnormal ECG 02/13/2014  . Murmur 02/13/2014  . History of abdominal pain   . Rib pain    PCP:  Leonel Ramsay,  MD Pharmacy:   [id number] - [place], [place] [address] Phone: [phone] Fax: [phone]     Social Determinants of Health (SDOH) Interventions    Readmission Risk Interventions No flowsheet data found.

## 2020-12-18 NOTE — Consult Note (Signed)
Consultation Note Date: 12/18/2020   Patient Name: Krista Parker  DOB: 16-Jun-1932  MRN: [id number]  Age / Sex: 87 y.o., female  PCP: Leonel Ramsay, MD Referring Physician: Angelica Pou, MD  Reason for Consultation: Establishing goals of care and Psychosocial/spiritual support  HPI/Patient Profile: 87 y.o. female   admitted on 12/14/2020 with past medical history significant for chronic hypoxic respiratory failure (on 3L oxygen continuously at baseline), mild intermittent asthma, prior tobacco use (55-pack year smoking history), HFpEF (grade 1 diastolic dysfunction, EF of 60%, mitral regurgitation and aortic insufficiency), osteoporosis, HTN and GERD who presented to [facility] on 12/14/20 following a fall.   Apparently patient was under hospice services at [facility] where she was living independently, however according to the daughter that patient had approximately 12 hours of services and support.    Family report continued slow physical and functional decline over the past many months with worsening confusion specifically at night.  Transported to ER after a fall at her facility  DG of hip revealed displaced R femoral neck fracture and pt was admitted on 12/14/20. Underwent Rt anterior THA on 3/28.   Today patient is alert and oriented and is working well with therapies, she seems to be progressing.  Patient tells me that her goal is to get stronger and return home.  Patient and family face ongoing treatment option decisions, advanced directive decisions and anticipatory care needs.  Clinical Assessment and Goals of Care:   This NP Wadie Lessen reviewed medical records, received report from team, assessed the patient and then meet at the patient's bedside and spoke to daughter/Roberta   to discuss diagnosis, prognosis, GOC, EOL wishes disposition and options.   Concept of Palliative Care  was introduced as specialized medical care for people and their families living with serious illness.  If focuses on providing relief from the symptoms and stress of a serious illness.  The goal is to improve quality of life for both the patient and the family.  Values and goals of care important to patient and family were attempted to be elicited.  Created space and opportunity for  family to explore thoughts and feelings regarding current medical situation.  Patient's daughter verbalizes concern over her mother's continued failure to thrive and increasing care needs at [facility].  Main concern is if the patient will be able to return to independent living in the future.  A  discussion was had today regarding advanced directives.  Concepts specific to code status, artifical feeding and hydration, continued IV antibiotics and rehospitalization was had.  The difference between a aggressive medical intervention path  and a palliative comfort care path for this patient at this time was had.     Questions and concerns addressed.  Patient  encouraged to call with questions or concerns.     PMT will continue to support holistically.   Discussed with patient/family  the importance of continued conversation with family and their  medical providers regarding overall plan of care and treatment options,  ensuring  decisions are within the context of the patients values and GOCs.  Recommend outpatient/community-based palliative to follow on discharge to continue goals of care discussion and documentation depending on patient outcomes into the future      HPOA:  Dareen Piano and Cornelia Copa [place]    SUMMARY OF RECOMMENDATIONS    Code Status/Advance Care Planning:  DNR     Symptom Management:   Education offered to daughter regarding polypharmacy noted on her home med list prior to admission.  Patient  currently reports comfort on her scheduled Tylenol and as needed  hydrocodone.  Palliative Prophylaxis:   Bowel Regimen, Delirium Protocol, Frequent Pain Assessment and Oral Care  Additional Recommendations (Limitations, Scope, Preferences):  Full Scope Treatment  Psycho-social/Spiritual:   Desire for further Chaplaincy support:no  Additional Recommendations: Education on Hospice  Prognosis:   Unable to determine  Discharge Planning: [place] for rehab with Palliative care service follow-up      Primary Diagnoses: Present on Admission: . Closed displaced fracture of right femoral neck ([place])   I have reviewed the medical record, interviewed the patient and family, and examined the patient. The following aspects are pertinent.  Past Medical History:  Diagnosis Date  . Aortic insufficiency   . Asthma   . History of abdominal pain   . Hypertension   . Mitral regurgitation   . Pneumonia   . Rib pain   . Superficial basal cell carcinoma (BCC) 07/15/2016   right neck   Social History   Socioeconomic History  . Marital status: Widowed    Spouse name: Not on file  . Number of children: Not on file  . Years of education: Not on file  . Highest education level: Not on file  Occupational History  . Not on file  Tobacco Use  . Smoking status: Former Research scientist (life sciences)  . Smokeless tobacco: Never Used  Vaping Use  . Vaping Use: Never used  Substance and Sexual Activity  . Alcohol use: Yes  . Drug use: No  . Sexual activity: Not Currently  Other Topics Concern  . Not on file  Social History Narrative  . Not on file   Social Determinants of Health   Financial Resource Strain: Not on file  Food Insecurity: Not on file  Transportation Needs: Not on file  Physical Activity: Not on file  Stress: Not on file  Social Connections: Not on file   History reviewed. No pertinent family history. Scheduled Meds: . acetaminophen  1,000 mg Oral Q6H  . enoxaparin (LOVENOX) injection  40 mg Subcutaneous Q24H  . feeding supplement   237 mL Oral TID BM  . metoprolol succinate  12.5 mg Oral Daily  . multivitamin with minerals  1 tablet Oral Daily  . mupirocin ointment  1 application Nasal BID  . [START ON 12/19/2020] pantoprazole  20 mg Oral Daily  . polyethylene glycol  17 g Oral Daily  . senna  2 tablet Oral BID  . sertraline  50 mg Oral q AM   Continuous Infusions: PRN Meds:.albuterol, calcium carbonate, HYDROcodone-acetaminophen, LORazepam, ondansetron **OR** ondansetron (ZOFRAN) IV Medications Prior to Admission:  Prior to Admission medications   Medication Sig Start Date End Date Taking? Authorizing Provider  albuterol (VENTOLIN HFA) 108 (90 Base) MCG/ACT inhaler Inhale 2 puffs into the lungs every 6 (six) hours as needed for wheezing or shortness of breath. 12/17/19  Yes Nicole Kindred A, DO  ALPRAZolam (XANAX) 0.25 MG tablet Take 0.25 mg by mouth every 4 (four) hours as  needed for anxiety.   Yes [provider]  alum & mag hydroxide-simeth (MAALOX/MYLANTA) 200-200-20 MG/5ML suspension Take 30 mLs by mouth every 4 (four) hours as needed for indigestion or heartburn. 12/17/19  Yes Nicole Kindred A, DO  calcium carbonate (TUMS EX) 750 MG chewable tablet Chew 1 tablet by mouth as needed for heartburn.   Yes [provider]  clonazePAM (KLONOPIN) 0.5 MG tablet Take 0.5 mg by mouth See admin instructions. Take 0.5 mg by mouth in the morning, at 1 PM, and at bedtime   Yes [provider]  dexamethasone (DECADRON) 4 MG tablet Take 4 mg by mouth daily with breakfast.   Yes [provider]  diphenhydrAMINE (BENADRYL) 25 mg capsule Take 25 mg by mouth daily as needed for allergies.    Yes [provider]  enoxaparin (LOVENOX) 40 MG/0.4ML injection Inject 0.4 mLs (40 mg total) into the skin daily for 14 days. 12/15/20 12/29/20 Yes Leandrew Koyanagi, MD  fentaNYL (DURAGESIC) 12 MCG/HR Place 1 patch onto the skin every 3 (three) days.   Yes [provider]  fentaNYL (DURAGESIC) 75  MCG/HR Place 1 patch onto the skin every 3 (three) days.   Yes [provider]  furosemide (LASIX) 80 MG tablet Take 40 mg by mouth in the morning.   Yes [provider]  haloperidol (HALDOL) 1 MG tablet Take 1 mg by mouth every 4 (four) hours as needed for agitation (or nausea/vomiting).   Yes [provider]  HYDROcodone-acetaminophen (NORCO) 5-325 MG tablet Take 1 tablet by mouth every 6 (six) hours as needed. 12/15/20  Yes Leandrew Koyanagi, MD  HYDROmorphone (DILAUDID) 2 MG tablet Take 2-4 mg by mouth every 2 (two) hours as needed (for pain or shortness of breath).   Yes [provider]  loratadine (CLARITIN) 10 MG tablet Take 10 mg by mouth at bedtime as needed for allergies.   Yes [provider]  metoprolol succinate (TOPROL-XL) 50 MG 24 hr tablet Take 50 mg by mouth in the morning. Take with or immediately following a meal.   Yes [provider]  Multiple Vitamins-Minerals (ONE-A-DAY WOMENS 50+) TABS Take 1 tablet by mouth daily with breakfast.   Yes [provider]  omeprazole (PRILOSEC) 40 MG capsule Take 40 mg by mouth in the morning and at bedtime.   Yes [provider]  ondansetron (ZOFRAN-ODT) 4 MG disintegrating tablet Take 4 mg by mouth every 4 (four) hours as needed for nausea.   Yes [provider]  polyethylene glycol powder (GLYCOLAX/MIRALAX) powder Take 8.5 g by mouth at bedtime as needed for moderate constipation ([place]).   Yes [provider]  potassium chloride SA (KLOR-CON) 20 MEQ tablet Take 40 mEq by mouth in the morning.   Yes [provider]  senna (SENOKOT) 8.6 MG tablet Take 1 tablet by mouth See admin instructions. Take 1 tablet by mouth two times a day and HOLD FOR DIARRHEA   Yes [provider]  sertraline (ZOLOFT) 25 MG tablet Take 25 mg by mouth in the morning.   Yes [provider]  clotrimazole (GYNE-LOTRIMIN) 1 % vaginal cream Place 1  Applicatorful vaginally at bedtime. Patient not taking: Reported on 12/14/2020 12/17/19   Nicole Kindred A, DO  furosemide (LASIX) 40 MG tablet Take 1 tablet (40 mg total) by mouth daily as needed for edema. Patient not taking: No sig reported 12/17/19 12/16/20  Nicole Kindred A, DO  lisinopril (ZESTRIL) 10 MG tablet  Take 1 tablet (10 mg total) by mouth daily. Patient not taking: Reported on 12/14/2020 12/17/19   Nicole Kindred A, DO  melatonin 5 MG TABS Take 1 tablet (5 mg total) by mouth at bedtime as needed (sleep). Patient not taking: Reported on 12/14/2020 12/17/19   Nicole Kindred A, DO   Allergies  Allergen Reactions  . Tape Other (See Comments)    SKIN IS PAPER-THIN- WILL SPLIT EASILY- PLEASE USE EASY-RELEASE TAPE, IF ANY AT ALL!!  . Cefdinir Diarrhea  . Lisinopril Cough   Review of Systems  Constitutional: Positive for fatigue.  Neurological: Positive for weakness.    Physical Exam  Vital Signs: BP 126/68   Pulse 73   Temp 97.6 F (36.4 C) (Oral)   Resp 19   Ht 5\' 1"  (1.549 m)   Wt 73.5 kg   SpO2 96%   BMI 30.62 kg/m  Pain Scale: 0-10 POSS *See Group Information*: 1-Acceptable,Awake and alert Pain Score: 0-No pain   SpO2: SpO2: 96 % O2 Device:SpO2: 96 % O2 Flow Rate: .O2 Flow Rate (L/min): 3 L/min  IO: Intake/output summary:   Intake/Output Summary (Last 24 hours) at 12/18/2020 1233 Last data filed at 12/18/2020 1045 Gross per 24 hour  Intake 1138.11 ml  Output 852 ml  Net 286.11 ml    LBM: Last BM Date: 12/18/20 Baseline Weight: Weight: 72.6 kg Most recent weight: Weight: 73.5 kg     Palliative Assessment/Data:   30 %    Discussed with Dr Wynetta Emery and PT  Time In: 1100 Time Out: 1215 Time Total: 75 minutes Greater than 50%  of this time was spent counseling and coordinating care related to the above assessment and plan.  Signed by: Wadie Lessen, NP   Please contact Palliative Medicine Team phone at [phone] for questions and concerns.  For  individual provider: See Shea Evans

## 2020-12-18 NOTE — Progress Notes (Signed)
Subjective:   Overnight, no acute events.  This morning, patient reports that she feels well and denies pain. She has no current complaints or concerns aside from her desire to work with physical therapy and occupational therapy today. She clearly reports to our team that she wants to do everything she can to get back to [facility] including discharging to a skilled nursing facility for a period of rehabilitation. Patient states her family wants her "to go back, stay in bed, and not walk." She expresses this is not her plan, she would like more independence, but is worried about her balance. She understands that her balance was likely significantly affected by her medication regimen and that the physical and occupational therapists can help with balance and provide appropriate equipment recommendations  Objective:  Vital signs in last 24 hours: Vitals:   12/18/20 0000 12/18/20 0300 12/18/20 0326 12/18/20 0332  BP: 128/70  114/64   Pulse: 83 69 66   Resp: 20 20 18    Temp: 98.6 F (37 C)  98.3 F (36.8 C)   TempSrc: Oral  Oral   SpO2: 92% 98% 98%   Weight:    73.5 kg  Height:      On 3L oxygen via nasal cannula  Intake/Output Summary (Last 24 hours) at 12/18/2020 0649 Last data filed at 12/18/2020 0618 Gross per 24 hour  Intake 1178.11 ml  Output 850 ml  Net 328.11 ml   Physical Exam Vitals and nursing note reviewed.  Constitutional:      General: She is not in acute distress.    Appearance: She is not ill-appearing.     Comments: Pleasant, elderly female, sitting upright in hospital bed enjoying her breakfast independently without assistance. Patient engaging in conversation appropriately  Cardiovascular:     Rate and Rhythm: Normal rate and regular rhythm.     Pulses: Normal pulses.     Heart sounds: Normal heart sounds.  Pulmonary:     Effort: Pulmonary effort is normal. No respiratory distress.     Breath sounds: Normal breath sounds.  Abdominal:     General:  Abdomen is flat. Bowel sounds are normal.     Palpations: Abdomen is soft.     Tenderness: There is no abdominal tenderness.  Musculoskeletal:     Right lower leg: No edema.     Left lower leg: No edema.  Skin:    General: Skin is warm and dry.     Capillary Refill: Capillary refill takes less than 2 seconds.  Neurological:     General: No focal deficit present.     Mental Status: She is oriented to person, place, and time. Mental status is at baseline.  Psychiatric:        Mood and Affect: Mood normal.        Behavior: Behavior normal.        Thought Content: Thought content normal.        Judgment: Judgment normal.    Labs in last 24 hours: CBC Latest Ref Rng & Units 12/18/2020 12/17/2020 12/16/2020  WBC 4.0 - 10.5 K/uL 8.5 9.5 13.5(H)  Hemoglobin 12.0 - 15.0 g/dL 9.6(L) 9.5(L) 10.4(L)  Hematocrit 36.0 - 46.0 % 29.5(L) 29.1(L) 30.8(L)  Platelets 150 - 400 K/uL 180 189 199   BMP Latest Ref Rng & Units 12/18/2020 12/17/2020 12/16/2020  Glucose 70 - 99 mg/dL 85 85 133(H)  BUN 8 - 23 mg/dL 11 11 13   Creatinine 0.44 - 1.00 mg/dL 0.74 0.95 0.83  Sodium 135 - 145 mmol/L 133(L) 132(L) 133(L)  Potassium 3.5 - 5.1 mmol/L 3.9 3.6 3.6  Chloride 98 - 111 mmol/L 103 98 101  CO2 22 - 32 mmol/L 25 28 25   Calcium 8.9 - 10.3 mg/dL 7.9(L) 8.3(L) 8.0(L)  Magnesium - 1.8  Imaging in last 24 hours: No results found.  Assessment/Plan:  Principal Problem:   Closed displaced fracture of right femoral neck (HCC) Active Problems:   Protein-calorie malnutrition, severe   Pressure injury of skin  Jason Hauge is a 87 year old woman living at [facility] currently on hospice care with past medical history significant for chronic hypoxic respiratory failure (on 3L oxygen), HFpEF, osteoporosis and HTN who presented to [facility] on 12/14/20 following a fall found to have a displaced right femoral neck fracture now status post right hip arthroplasty.  #Goals of care Patient presented to our team  as a hospice patient living at [facility] for the past one year. She was placed on hospice for diagnosis of COPD (although PFTs not consistent) and heart failure (of which she has grade 1 diastolic dysfunction with normal EF). She has tolerated a significant medication reconciliation without complaints. Patient does not require the medication regimen that she presented with which includes two fentanyl patches (discontinued), haloperidol PRN (discontinued), scheduled clonazepam (discontinued), PRN alprazolam (discontinued), PRN hydromorphone (discontinued). Patient's pain following her fall and operation has been appropriately controlled on Tylenol alone. Her anxiety has been well controlled on PRN lorazepam and an increase in her home sertraline. Patient's goal is improving her functional independence and she is highly motivated to rehabilitate. Patient does not currently have a terminal diagnosis. -Palliative care team consulted for assistance -Hospice organization notified of recommendation for SNF -Our team recommends SNF -Patient desires SNF with transition back to [facility] (or similar) -Will need to discuss with family regarding appropriate disposition  #Displaced right femoral neck fracture status post right hip arthroplasty Patient is recovering very well from her operation. She denies any pain at this time. She would benefit from discharging to a skilled nursing facility for rehabilitation from her recent fall and fracture. She has tolerated incredibly well a steady titration down of her home pain medication regimen. -Continue acetaminophen 1000mg  Q6H -Continue hydrocodone-acetaminophen 5-325mg  Q4H PRN (not taking, may discontinue) -Continue PT/OT, appreciate recommendations -Weight bearing as tolerated -Follow-up with Dr. Erlinda Hong in two weeks  #Anxiety, chronic Patient has tolerated a de-escalation of her home benzodiazepine regimen with no complaints. -Continue lorazepam 0.5mg   three times daily PRN (may be able to decrease further) -Continue sertraline 50mg  daily  #Hypertension, chronic Patient's blood pressure relatively well controlled with titration down of her home metoprolol from 50mg  daily to 12.5mg  daily. Will continue to monitor for need for further dose adjustment versus discontinuation. -Continue metoprolol ER 12.5mg  daily  #Chronic hypoxic respiratory failure on 3L oxygen via nasal cannula, chronic Patient stable on home oxygen requirement of 3L. -Continue home supplemental oxygen to maintain SpO2 >92% -Albuterol 2 puffs Q6H PRN available, however patient has not required since admission  #GERD, chronic Patient continues to deny abdominal pain. -Decrease pantoprazole to 20mg  daily -Continue calcium carbonate 500mg  three times daily  PRN  #Code status: DNR #Diet: Heart healthy #Bowel regimen: Senna 17.4mg  twice daily and Miralax 17g daily (likely can de-escalate) #IVF: None today #VTE ppx: Enoxaparin 40mg  daily  Prior to Admission Living Arrangement: [place] Anticipated Discharge Location: Pending, recommended SNF Barriers to Discharge: Discharge planning Dispo: Anticipated discharge in approximately 1-2 day(s).  Cato Mulligan, MD  12/18/2020, 6:49 AM Pager: [phone] After 5pm on weekdays and 1pm on weekends: On Call pager [phone]

## 2020-12-19 LAB — BASIC METABOLIC PANEL
Anion gap: 6 (ref 5–15)
BUN: 9 mg/dL (ref 8–23)
CO2: 25 mmol/L (ref 22–32)
Calcium: 8.1 mg/dL — ABNORMAL LOW (ref 8.9–10.3)
Chloride: 102 mmol/L (ref 98–111)
Creatinine, Ser: 0.63 mg/dL (ref 0.44–1.00)
GFR, Estimated: >60 mL/min (ref >60)
Glucose, Bld: 86 mg/dL (ref 70–99)
Potassium: 3.4 mmol/L — ABNORMAL LOW (ref 3.5–5.1)
Sodium: 133 mmol/L — ABNORMAL LOW (ref 135–145)

## 2020-12-19 LAB — CBC
HCT: 29.9 % — ABNORMAL LOW (ref 36.0–46.0)
Hemoglobin: 10 g/dL — ABNORMAL LOW (ref 12.0–15.0)
MCH: 29.2 pg (ref 26.0–34.0)
MCHC: 33.4 g/dL (ref 30.0–36.0)
MCV: 87.2 fL (ref 80.0–100.0)
Platelets: 249 10*3/uL (ref 150–400)
RBC: 3.43 MIL/uL — ABNORMAL LOW (ref 3.87–5.11)
RDW: 15.5 % (ref 11.5–15.5)
WBC: 8.9 10*3/uL (ref 4.0–10.5)
nRBC: 0 % (ref 0.0–0.2)

## 2020-12-19 LAB — MAGNESIUM: Magnesium: 1.7 mg/dL (ref 1.7–2.4)

## 2020-12-19 MED ORDER — METOPROLOL SUCCINATE ER 25 MG PO TB24
25.0000 mg | ORAL_TABLET | Freq: Every day | ORAL | Status: DC
  Administered 2020-12-19: 25 mg via ORAL
  Filled 2020-12-19: qty 1

## 2020-12-19 MED ORDER — CHLORHEXIDINE GLUCONATE CLOTH 2 % EX PADS: 6.0000 | MEDICATED_PAD | Freq: Every day | CUTANEOUS | Status: DC

## 2020-12-19 MED ORDER — POTASSIUM CHLORIDE CRYS ER 20 MEQ PO TBCR
30.0000 meq | EXTENDED_RELEASE_TABLET | Freq: Three times a day (TID) | ORAL | Status: AC
  Administered 2020-12-19 (×2): 30 meq via ORAL
  Filled 2020-12-19 (×2): qty 1

## 2020-12-19 MED ORDER — POLYETHYLENE GLYCOL 3350 17 G PO PACK: 17.0000 g | PACK | Freq: Every day | ORAL | 0 refills | Status: AC

## 2020-12-19 MED ORDER — ACETAMINOPHEN 500 MG PO TABS: 1000.0000 mg | ORAL_TABLET | Freq: Four times a day (QID) | ORAL | 0 refills | Status: AC

## 2020-12-19 MED ORDER — CLONAZEPAM 0.25 MG PO TBDP: 0.2500 mg | ORAL_TABLET | Freq: Three times a day (TID) | ORAL | 0 refills | Status: DC

## 2020-12-19 MED ORDER — CLONAZEPAM 0.5 MG PO TABS: 0.2500 mg | ORAL_TABLET | Freq: Three times a day (TID) | ORAL | Status: DC

## 2020-12-19 MED ORDER — SERTRALINE HCL 50 MG PO TABS: 50.0000 mg | ORAL_TABLET | Freq: Every morning | ORAL | 0 refills | Status: AC

## 2020-12-19 MED ORDER — SENNA 8.6 MG PO TABS: 1.0000 | ORAL_TABLET | Freq: Every day | ORAL | 0 refills | Status: AC

## 2020-12-19 MED ORDER — CLONAZEPAM 0.25 MG PO TBDP: 0.2500 mg | ORAL_TABLET | Freq: Three times a day (TID) | ORAL | 0 refills | Status: AC

## 2020-12-19 MED ORDER — COVID-19 MRNA VACC (MODERNA) 50 MCG/0.25ML IM SUSP
0.2500 mL | Freq: Once | INTRAMUSCULAR | Status: AC
  Administered 2020-12-19: 0.25 mL via INTRAMUSCULAR
  Filled 2020-12-19 (×2): qty 0.25

## 2020-12-19 MED ORDER — MAGNESIUM SULFATE IN D5W 1-5 GM/100ML-% IV SOLN
1.0000 g | Freq: Once | INTRAVENOUS | Status: AC
  Administered 2020-12-19: 1 g via INTRAVENOUS
  Filled 2020-12-19: qty 100

## 2020-12-19 MED ORDER — CLONAZEPAM 0.25 MG PO TBDP
0.2500 mg | ORAL_TABLET | Freq: Three times a day (TID) | ORAL | Status: DC
  Administered 2020-12-19 (×3): 0.25 mg via ORAL
  Filled 2020-12-19 (×3): qty 1

## 2020-12-19 MED ORDER — ENOXAPARIN SODIUM 40 MG/0.4ML ~~LOC~~ SOLN: 40.0000 mg | Freq: Every day | SUBCUTANEOUS | 0 refills | Status: AC

## 2020-12-19 MED ORDER — METOPROLOL SUCCINATE ER 25 MG PO TB24: 25.0000 mg | ORAL_TABLET | Freq: Every day | ORAL | 0 refills | Status: AC

## 2020-12-19 MED ORDER — SENNA 8.6 MG PO TABS: 1.0000 | ORAL_TABLET | Freq: Every day | ORAL | Status: DC

## 2020-12-19 NOTE — Discharge Instructions (Signed)
Distal Femur Fracture Treated With Immobilization  A distal femur fracture is a break in the lower part of the thigh bone (femur) near the knee joint. If the fracture is stable and the bone is still in the normal position (nondisplaced), the injury may be treated with immobilization. This involves the use of a cast or splint to hold the leg in place. Immobilization ensures that the bone continues to stay in the correct position while the leg is healing. What are the causes? This condition may be caused by:  A fall. This injury can result from the impact of the fall or other violent contact.  A motor vehicle accident.  A sports injury.  Other collisions with a hard surface. What increases the risk? You are more likely to develop this condition if:  You are female.  You are 6-94 years old.  You participate in high-energy sports such as soccer, ice hockey, football, and baseball.  You have a condition that weakens the bones, such as osteoporosis.  You have had a knee replacement. What are the signs or symptoms? Symptoms of this condition include:  Pain.  Swelling.  Bruising.  Inability to bend your knee.  Misshapen knee.  Inability to walk.  Inability to use your injured leg to support your body weight. How is this diagnosed? This condition may be diagnosed based on:  Your symptoms.  A physical exam.  Other tests, such as: ? Imaging studies, such as an X-ray, CT scan, MRI scan, or ultrasound. ? A procedure to view the inside of your knee with a small camera (arthroscopy). How is this treated? This condition may be treated with:  A splint. You will wear the splint until the swelling goes down.  A cast. This is used to keep the fractured bone from moving while it heals. A cast is usually put on after swelling has gone down.  Physical therapy. Follow these instructions at home: Medicines  Take over-the-counter and prescription medicines only as told by your  health care provider.  Do not drive or operate heavy machinery while taking pain medicine.  If you are taking prescription pain medicine, take actions to prevent or treat constipation. Your health care provider may recommend that you: ? Drink enough fluid to keep your urine pale yellow. ? Eat foods that are high in fiber, such as fresh fruits and vegetables, whole grains, and beans. ? Limit foods that are high in fat and processed sugars, such as fried or sweet foods. ? Take an over-the-counter or prescription medicine for constipation. If you have a splint:  Wear the splint as told by your health care provider. Remove it only as told by your health care provider.  Loosen the splint if your toes tingle, become numb, or turn cold and blue.  Keep the splint clean.  If the splint is not waterproof: ? Do not let it get wet. ? Cover it with a watertight covering when you take a bath or a shower. If you have a cast:  Do not stick anything inside the cast to scratch your skin. Doing that increases your risk of infection.  Check the skin around the cast every day. Tell your health care provider about any concerns.  You may put lotion on dry skin around the edges of the cast. Do not put lotion on the skin underneath the cast.  Keep the cast clean.  If the cast is not waterproof: ? Do not let it get wet. ? Cover it with  a watertight covering when you take a bath or a shower. Activity  Do not use your leg to support your body weight until your health care provider says that you can. Follow weight-bearing restrictions.  Use crutches, a cane, or a walker as directed.  Return to your normal activities as directed by your health care provider. Ask your health care provider what activities are safe for you.  Do not drive until your health care provider approves. Managing pain, stiffness, and swelling  If directed, put ice on the injured area: ? If you have a removable splint, remove it  as told by your health care provider. ? Put ice in a plastic bag. ? Place a towel between your skin and the bag. ? Leave the ice on for 20 minutes, 2-3 times a day.  Move your toes often to avoid stiffness and to lessen swelling.  Raise the injured area above the level of your heart while you are lying down.   General instructions  Do not put pressure on any part of the cast or splint until it is fully hardened. This may take several hours.  Do not use any products that contain nicotine or tobacco, such as cigarettes and e-cigarettes. These can delay bone healing. If you need help quitting, ask your health care provider.  Keep all follow-up visits as told by your health care provider. This is important.   Contact a health care provider if:  You have knee pain and swelling.  You have trouble walking.  Your cast becomes wet or damaged or suddenly feels too tight. Get help right away if:  Your pain and swelling get worse.  You have severe pain below the fracture.  Your skin or toenails turn blue or gray, feel cold, or become numb.  You have fluid, blood, or pus coming from under your cast.  You develop a fever.  You have pain, swelling, or redness in your leg. Summary  A distal femur fracture is a break in the lower part of the thigh bone (femur).  Falls are the most common causes of femur fractures, but sports-related injuries and motor vehicle accidents also can cause this.  If the fracture is stable and the bone is still in the normal position (nondisplaced), the injury may be treated with immobilization. Immobilization ensures that the bone continues to stay in the correct position while the leg is healing. This information is not intended to replace advice given to you by your health care provider. Make sure you discuss any questions you have with your health care provider. Document Revised: 09/05/2019 Document Reviewed: 10/26/2017 Elsevier Patient Education  2021  [place].   Hip Fracture  A hip fracture is a break in the upper part of the thigh bone (femur). This is usually the result of an injury, commonly a fall. What are the causes? This condition may be caused by:  A direct hit or injury (trauma) to the side of the hip, such as from a fall or a car accident. What increases the risk? You are more likely to develop this condition if:  You have poor balance or an unsteady walking pattern (gait). Certain conditions contribute to poor balance, including Parkinson disease and dementia.  You have thinning or weakening of your bones, such as from osteopenia or osteoporosis.  You have cancer that spreads to the leg bones.  You have certain conditions that can weaken your bones, such as thyroid disorders, intestine disorders, or a lack (deficiency) of certain  nutrients.  You smoke.  You take certain medicines, such as steroids.  You have a history of broken bones. What are the signs or symptoms? Symptoms of this condition include:  Pain over the injured hip. This is commonly felt on the side of the hip or in the front groin area.  Stiffness, bruising, and swelling over the hip.  Pain with movement of the leg, especially lifting it up. Pain often gets better with rest.  Difficulty or inability to stand, walk, or use the leg to support body weight (put weight on the leg).  The leg rolling outward when lying down.  The affected leg being shorter than the other leg. How is this diagnosed? This condition may be diagnosed based on:  Your symptoms.  A physical exam.  X-rays. These may be done: ? To confirm the diagnosis. ? To determine the type and location of the fracture. ? To check for other injuries.  MRI or CT scans. These may be done if the fracture is not visible on an X-ray. How is this treated? Treatment for this condition depends on the severity and location of your fracture. In most cases, surgery is necessary. Surgery  may involve:  Repairing the fracture with a screw, nail, or rod to hold the bone in place (open reduction and internal fixation, ORIF).  Replacing the damaged parts of the femur with metal implants (hemiarthroplasty or arthroplasty). If your fracture is less severe, or if you are not eligible for surgery, you may have non-surgical treatment. Non-surgical treatment may involve:  Using crutches, a walker, or a wheelchair until your health care provider says that you can support (bear) weight on your hip.  Medicines to help reduce pain and swelling.  Having regular X-rays to monitor your fracture and make sure that it is healing.  Physical therapy. You may need physical therapy after surgery, too. Follow these instructions at home: Activity  Do not use your injured leg to support your body weight until your health care provider says that you can. ? Follow standing and walking restrictions as told by your health care provider. ? Use crutches, a walker, or a wheelchair as directed.  Avoid any activities that cause pain or irritation in your hip. Ask your health care provider what activities are safe for you.  Do not drive or use heavy machinery until your health care provider approves.  If physical therapy was prescribed, do exercises as told by your health care provider. General instructions  Take over-the-counter and prescription medicines only as told by your health care provider.  If directed, put ice on the injured area: ? Put ice in a plastic bag. ? Place a towel between your skin and the bag. ? Leave the ice on for 20 minutes, 2-3 times a day.  Do not use any products that contain nicotine or tobacco, such as cigarettes and e-cigarettes. These can delay bone healing. If you need help quitting, ask your health care provider.  Keep all follow-up visits as told by your health care provider. This is important.   How is this prevented?  To prevent falls at home: ? Use a cane,  walker, or wheelchair as directed. ? Make sure your rooms and hallways are free of clutter, obstacles, and cords. ? Install grab bars in your bedroom and bathrooms. ? Always use handrails when going up and down stairs. ? Use nightlights around the house.  Exercise regularly. Ask what forms of exercise are safe for you, such as walking  and strength and balance exercises.  Visit an eye doctor regularly to have your eyesight checked. This can help prevent falls.  Make sure you get enough calcium and vitamin D.  Do not use any products that contain nicotine or tobacco, such as cigarettes and e-cigarettes. If you need help quitting, ask your health care provider.  Limit alcohol use.  If you have an underlying condition that caused your hip fracture, work with your health care provider to manage your condition. Contact a health care provider if:  Your pain gets worse or it does not get better with rest or medicine.  You develop any of the following in your leg or foot: ? Numbness. ? Tingling. ? A change in skin color (discoloration). ? Skin feeling cold to the touch. Get help right away if:  Your pain suddenly gets worse.  You cannot move your hip. Summary  A hip fracture is a break in the upper part of the thigh bone (femur).  Treatment typically requires surgical management to restore stability and function to the hip.  Pain medicine and icing of the affected leg can help manage pain and swelling. Follow directions as told by your health care provider. This information is not intended to replace advice given to you by your health care provider. Make sure you discuss any questions you have with your health care provider. Document Revised: 05/09/2020 Document Reviewed: 05/09/2020 Elsevier Patient Education  2021 [place].   Femoral Shaft Fracture  A femoral shaft fracture is a break in the long, straight part (shaft) of the thigh bone (femur). This condition is almost  always treated with surgery. What are the causes? This condition may be caused by a forceful impact, such as from:  A fall, especially from a great height.  A sports injury.  A car or motorcycle accident. What increases the risk? This condition is more likely to develop in people who:  Are older. The risk increases with age.  Have certain medical conditions that cause bones to become weak and thin, such as osteoporosis.  Take medicines for osteoporosis.  Play high-risk or high-impact sports. What are the signs or symptoms? Symptoms of this condition include:  Severe pain.  Inability to walk.  Bruising.  Swelling.  The thigh looking misshapen (deformity). If the fracture broke the skin (open fracture), there may also be bleeding. How is this diagnosed? This condition is diagnosed based on:  Your symptoms and medical history.  A physical exam.  X-rays. How is this treated? This condition is usually treated with one or more of the following:  Surgery to fix the bone pieces into place with pins that are attached to a stabilizing bar outside your skin (external fixation).  Surgery to insert a rod and screws into your femur (intramedullary nailing). If you had external fixation, you may also need intramedullary nailing a few weeks later.  Surgery to place metal plates on the femur to hold it in place. This may be done if your fracture is closer to an end of your femur. In rare cases, surgery may not be an option. In that case, a cast or a splint will be placed on your leg to hold it in place while the bone heals (immobilization). Treatment may also include:  Not putting weight on your leg until it heals (weight-bearing restrictions).  Using a device to help you move around (assistive device), such as crutches or a wheelchair.  Physical therapy. Follow these instructions at home: If you have  a cast:  Do not stick anything inside the cast to scratch your skin. Doing  that increases your risk of infection.  Check the skin around the cast every day. Tell your health care provider about any concerns.  You may put lotion on dry skin around the edges of the cast. Do not put lotion on the skin underneath the cast.  Keep the cast clean.  If the cast is not waterproof: ? Do not let it get wet. ? Cover it with a watertight covering when you take a bath or a shower. If you have a splint:  Wear the splint as told by your health care provider. Remove it only as told by your health care provider.  Loosen the splint if your toes tingle, become numb, or turn cold and blue.  Keep the splint clean.  If you have a splint that is not waterproof: ? Do not let it get wet. ? Cover it with a watertight covering when you take a bath or a shower. Activity  Do not use your leg to support your body weight until your health care provider says that you can. Follow weight-bearing restrictions.  Use crutches, a wheelchair, or other assistive devices as directed.  Ask your health care provider what activities are safe for you during recovery, and what activities you need to avoid.  Do physical therapy exercises as directed. Medicines  Take over-the-counter and prescription medicines only as told by your health care provider.  Ask your health care provider if you should take supplements of calcium and vitamins C and D to help your bone heal. Managing pain, stiffness, and swelling  If directed, put ice on painful areas: ? If you have a removable splint, remove it as told by your health care provider. ? Put ice in a plastic bag. ? Place a towel between your skin and the bag, or between your cast and the bag. ? Leave the ice on for 20 minutes, 2-3 times a day.  Move your toes often to avoid stiffness and to lessen swelling.  Raise (elevate) your lower leg above the level of your heart while you are lying down or sitting, whenever possible.   General instructions  Do  not put pressure on any part of the cast or splint until it is fully hardened, if applicable. This may take several hours.  Do not drive until your health care provider approves. You should not drive or use heavy machinery while taking prescription pain medicine.  Do not use any products that contain nicotine or tobacco, such as cigarettes and e-cigarettes. These can delay bone healing. If you need help quitting, ask your health care provider.  Do not take baths, swim, or use a hot tub until your health care provider approves. Ask your health care provider if you may take showers. You may only be allowed to take sponge baths.  Keep all follow-up visits as told by your health care provider. This is important. Contact a health care provider if you have:  Pain that gets worse or does not get better with medicine.  Redness or swelling that gets worse.  A fever. Get help right away if you have:  Any of the following symptoms in your toes or feet, even after loosening your splint: ? Coldness. ? Blue skin. ? Numbness. ? Tingling.  Severe pain.  Pain that gets worse when you move your toes.  Chest pain.  Shortness of breath. Summary  A femoral shaft fracture is a break  in the long, straight part (shaft) of your thigh bone (femur). This is almost always treated with surgery.  This condition may be caused by a forceful impact.  Do not use your leg to support your body weight until your health care provider says that you can. Follow weight-bearing restrictions as directed.  Keep all follow-up visits as told by your health care provider. This is important. This information is not intended to replace advice given to you by your health care provider. Make sure you discuss any questions you have with your health care provider. Document Revised: 10/24/2017 Document Reviewed: 10/24/2017 Elsevier Patient Education  2021 [place]   o Remove  items at home which could result in a fall. This includes throw rugs or furniture in walking pathways o ICE to the affected joint every three hours while awake for 30 minutes at a time, for at least the first 3-5 days, and then as needed for pain and swelling.  Continue to use ice for pain and swelling. You may notice swelling that will progress down to the foot and ankle.  This is normal after surgery.  Elevate your leg when you are not up walking on it.   o Continue to use the breathing machine you got in the hospital (incentive spirometer) which will help keep your temperature down.  It is common for your temperature to cycle up and down following surgery, especially at night when you are not up moving around and exerting yourself.  The breathing machine keeps your lungs expanded and your temperature down.   DIET:  As you were doing prior to hospitalization, we recommend a well-balanced diet.  DRESSING / WOUND CARE / SHOWERING  You may change your surgical dressing 7 days after surgery.  Then change the dressing every day with sterile gauze.  Please use good hand washing techniques before changing the dressing.  Do not use any lotions or creams on the incision until instructed by your surgeon.  You may shower while you have the surgical dressing which is waterproof.  After removal of surgical dressing, you must cover the incision when showering.  ACTIVITY  o Increase activity slowly as tolerated, but follow the weight bearing instructions below.   o No driving for 6 weeks or until further direction given by your physician.  You cannot drive while taking narcotics.  o No lifting or carrying greater than 10 lbs. until further directed by your surgeon. o Avoid periods of inactivity such as sitting longer than an hour when not asleep. This helps prevent blood clots.  o You may return to work once you are authorized by your doctor.     WEIGHT BEARING   Weight bearing as tolerated with assist  device (walker, cane, etc) as directed, use it as long as suggested by your surgeon or therapist, typically at least 4-6 weeks.   EXERCISES  Results after joint replacement surgery are often greatly improved when you follow the exercise, range of motion and muscle strengthening exercises prescribed by your doctor. Safety measures are also important to protect the joint from further injury. Any time any of these exercises cause you to have increased pain or swelling, decrease what you are doing until you are comfortable again and then slowly increase them. If you have problems or questions, call your caregiver or physical therapist for advice.   Rehabilitation is important following a joint replacement. After just a few days of immobilization, the muscles of the  leg can become weakened and shrink (atrophy).  These exercises are designed to build up the tone and strength of the thigh and leg muscles and to improve motion. Often times heat used for twenty to thirty minutes before working out will loosen up your tissues and help with improving the range of motion but do not use heat for the first two weeks following surgery (sometimes heat can increase post-operative swelling).   These exercises can be done on a training (exercise) mat, on the floor, on a table or on a bed. Use whatever works the best and is most comfortable for you.    Use music or television while you are exercising so that the exercises are a pleasant break in your day. This will make your life better with the exercises acting as a break in your routine that you can look forward to.   Perform all exercises about fifteen times, three times per day or as directed.  You should exercise both the operative leg and the other leg as well.  Exercises include:   . Quad Sets - Tighten up the muscle on the front of the thigh (Quad) and hold for 5-10 seconds.   . Straight Leg Raises - With your knee straight (if you were given a brace, keep it on),  lift the leg to 60 degrees, hold for 3 seconds, and slowly lower the leg.  Perform this exercise against resistance later as your leg gets stronger.  . Leg Slides: Lying on your back, slowly slide your foot toward your buttocks, bending your knee up off the floor (only go as far as is comfortable). Then slowly slide your foot back down until your leg is flat on the floor again.  [place] Haring Wings: Lying on your back spread your legs to the side as far apart as you can without causing discomfort.  . Hamstring Strength:  Lying on your back, push your heel against the floor with your leg straight by tightening up the muscles of your buttocks.  Repeat, but this time bend your knee to a comfortable angle, and push your heel against the floor.  You may put a pillow under the heel to make it more comfortable if necessary.   A rehabilitation program following joint replacement surgery can speed recovery and prevent re-injury in the future due to weakened muscles. Contact your doctor or a physical therapist for more information on knee rehabilitation.    CONSTIPATION  Constipation is defined medically as fewer than three stools per week and severe constipation as less than one stool per week.  Even if you have a regular bowel pattern at home, your normal regimen is likely to be disrupted due to multiple reasons following surgery.  Combination of anesthesia, postoperative narcotics, change in appetite and fluid intake all can affect your bowels.   YOU MUST use at least one of the following options; they are listed in order of increasing strength to get the job done.  They are all available over the counter, and you may need to use some, POSSIBLY even all of these options:    Drink plenty of fluids (prune juice may be helpful) and high fiber foods Colace 100 mg by mouth twice a day  Senokot for constipation as directed and as needed Dulcolax (bisacodyl), take with full glass of water  Miralax (polyethylene glycol)  once or twice a day as needed.  If you have tried all these things and are unable to have a bowel movement in  the first 3-4 days after surgery call either your surgeon or your primary doctor.    If you experience loose stools or diarrhea, hold the medications until you stool forms back up.  If your symptoms do not get better within 1 week or if they get worse, check with your doctor.  If you experience "the worst abdominal pain ever" or develop nausea or vomiting, please contact the office immediately for further recommendations for treatment.   ITCHING:  If you experience itching with your medications, try taking only a single pain pill, or even half a pain pill at a time.  You can also use Benadryl over the counter for itching or also to help with sleep.   TED HOSE STOCKINGS:  Use stockings on both legs until for at least 2 weeks or as directed by physician office. They may be removed at night for sleeping.  MEDICATIONS:  See your medication summary on the "After Visit Summary" that nursing will review with you.  You may have some home medications which will be placed on hold until you complete the course of blood thinner medication.  It is important for you to complete the blood thinner medication as prescribed.  PRECAUTIONS:  If you experience chest pain or shortness of breath - call 911 immediately for transfer to the hospital emergency department.   If you develop a fever greater that 101 F, purulent drainage from wound, increased redness or drainage from wound, foul odor from the wound/dressing, or calf pain - CONTACT YOUR SURGEON.                                                   FOLLOW-UP APPOINTMENTS:  If you do not already have a post-op appointment, please call the office for an appointment to be seen by your surgeon.  Guidelines for how soon to be seen are listed in your "After Visit Summary", but are typically between 1-4 weeks after surgery.  OTHER INSTRUCTIONS:   Knee  Replacement:  Do not place pillow under knee, focus on keeping the knee straight while resting. CPM instructions: 0-90 degrees, 2 hours in the morning, 2 hours in the afternoon, and 2 hours in the evening. Place foam block, curve side up under heel at all times except when in CPM or when walking.  DO NOT modify, tear, cut, or change the foam block in any way.  MAKE SURE YOU:  . Understand these instructions.  . Get help right away if you are not doing well or get worse.    Thank you for letting us be a part of your medical care team.  It is a privilege we respect greatly.  We hope these instructions will help you stay on track for a fast and full recovery!

## 2020-12-19 NOTE — Plan of Care (Signed)
  Problem: Education: Goal: Knowledge of General Education information will improve Description: Including pain rating scale, medication(s)/side effects and non-pharmacologic comfort measures Outcome: Progressing   Problem: Clinical Measurements: Goal: Diagnostic test results will improve Outcome: Completed/Met Goal: Respiratory complications will improve Outcome: Completed/Met Goal: Cardiovascular complication will be avoided Outcome: Completed/Met   Problem: Activity: Goal: Risk for activity intolerance will decrease Outcome: Progressing

## 2020-12-19 NOTE — Consult Note (Addendum)
[facility] Nurse Consult Note: Patient receiving care in 2C11. I spoke with the primary RN, April, via telephone to learn the circumstances of the consult order. Reason for Consult: care of a LE skin tear Wound type: skin tear Pressure Injury POA: Yes/No/NA Measurement: Wound bed: To be provided by the bedside RN in the flowsheet section Drainage (amount, consistency, odor)  Periwound: Dressing procedure/placement/frequency: For ALL skin tears on extremities, place Xeroform Kellie Simmering [phone]) over the skin tear and secure with a few turns of kerlex. Change daily. Moisten old dressing with saline if stuck to the wound. Thank you for the consult.  Discussed plan of care with the bedside nurse.  [facility] nurse will not follow at this time.  Please re-consult the [facility] team if needed.  Val Riles, RN, MSN, CWOCN, CNS-BC, pager [phone]

## 2020-12-19 NOTE — TOC Progression Note (Signed)
Transition of Care [facility]) - Progression Note    Patient Details  Name: Krista Parker MRN: [id number] Date of Birth: 09-16-1932  Transition of Care [facility]) CM/SW Contact  Joanne Chars, LCSW Phone Number: 12/19/2020, 10:45 AM  Clinical Narrative:   Bed offers presented to son Krista Parker by phone.  CSW directed him to medicare.gov and he was able to access the ratings information there.  He will review and research and call back with choice.  He is also going to speak to the hospice worker to revoke hospice.           Expected Discharge Plan and Services                                                 Social Determinants of Health (SDOH) Interventions    Readmission Risk Interventions No flowsheet data found.

## 2020-12-19 NOTE — Progress Notes (Signed)
Physical Therapy Treatment Patient Details Name: Krista Parker MRN: [id number] DOB: 13-Mar-1932 Today's Date: 12/19/2020    History of Present Illness Krista Parker is a 87 y/o female who presented to ED following a fall at [facility], where she is under hospice care. DG of hip revealed displaced R femoral neck fracture and pt was admitted on 12/14/20. Underwent Rt anterior THA on 3/28. Post op pt with Afib with RVR and hypotension. PMHx: end-stage COPD, CHF, respiratory failure on 3L, and superifical BCC.    PT Comments    Pt pleasantly confused and able to transition to sitting with slightly less assist and begin to shift feet more in standing for pivot to chair. Pt continues to require +2 assist for transfers and safety. Pt with noted skin tear to right lateral leg on arrival and RN aware and dressing. Pt educated for HEP and assisted with performing.  86-94% on 2L HR 77-86 BP 136/70   /Follow Up Recommendations  SNF;Supervision/Assistance - 24 hour     Equipment Recommendations  None recommended by PT    Recommendations for Other Services       Precautions / Restrictions Precautions Precautions: Fall Restrictions RLE Weight Bearing: Weight bearing as tolerated    Mobility  Bed Mobility Overal bed mobility: Needs Assistance Bed Mobility: Supine to Sit     Supine to sit: Mod assist;+2 for physical assistance     General bed mobility comments: cues and min assist to move legs toward EOB with mod +2 to elevate trunk and scoot fully to EOB    Transfers Overall transfer level: Needs assistance   Transfers: Sit to/from Stand Sit to Stand: Min assist;+2 physical assistance Stand pivot transfers: Max assist;+2 physical assistance       General transfer comment: min +2 to stand from bed with bil UE supported with use of belt and pad. Pivot toward left from bed to chair with support for trunk and physical assist to weight shift and step feet toward chair. no  knee buckling this session  Ambulation/Gait             General Gait Details: unable   Stairs             Wheelchair Mobility    Modified Rankin (Stroke Patients Only)       Balance Overall balance assessment: Needs assistance Sitting-balance support: No upper extremity supported;Feet supported Sitting balance-Leahy Scale: Fair Sitting balance - Comments: EOB without assist   Standing balance support: Bilateral upper extremity supported Standing balance-Leahy Scale: Poor Standing balance comment: Bil UE support and use of pad and belt in standing                            Cognition Arousal/Alertness: Awake/alert Behavior During Therapy: WFL for tasks assessed/performed Overall Cognitive Status: Impaired/Different from baseline Area of Impairment: Memory;Problem solving;Orientation;Following commands;Safety/judgement;Attention                 Orientation Level: Disoriented to;Place;Time;Situation Current Attention Level: Sustained Memory: Decreased short-term memory Following Commands: Follows one step commands with increased time Safety/Judgement: Decreased awareness of deficits   Problem Solving: Decreased initiation;Requires verbal cues;Slow processing;Difficulty sequencing General Comments: pt pleasantly confused stating we were in [place]. Pt wanting to get up but did not recall sx for THA      Exercises General Exercises - Lower Extremity Long Arc Quad: AROM;Both;10 reps;Seated Heel Slides: AAROM;Right;10 reps;Supine Hip Flexion/Marching: AAROM;AROM;Right;Left;Seated;10 reps (AAROM on RLE)  General Comments        Pertinent Vitals/Pain Faces Pain Scale: Hurts little more Pain Location: RLE with movement and with initial dressing application to skin tear on left calf by RN on arrival Pain Descriptors / Indicators: Discomfort;Grimacing;Operative site guarding Pain Intervention(s): Limited activity within patient's  tolerance;Monitored during session;Repositioned    Home Living                      Prior Function            PT Goals (current goals can now be found in the care plan section) Progress towards PT goals: Progressing toward goals    Frequency    Min 3X/week      PT Plan Current plan remains appropriate    Co-evaluation              AM-PAC PT "6 Clicks" Mobility   Outcome Measure  Help needed turning from your back to your side while in a flat bed without using bedrails?: A Little Help needed moving from lying on your back to sitting on the side of a flat bed without using bedrails?: Total Help needed moving to and from a bed to a chair (including a wheelchair)?: Total Help needed standing up from a chair using your arms (e.g., wheelchair or bedside chair)?: A Lot Help needed to walk in hospital room?: Total Help needed climbing 3-5 steps with a railing? : Total 6 Click Score: 9    End of Session Equipment Utilized During Treatment: Gait belt Activity Tolerance: Patient tolerated treatment well Patient left: in chair;with call bell/phone within reach;with chair alarm set Nurse Communication: Mobility status;Need for lift equipment PT Visit Diagnosis: Difficulty in walking, not elsewhere classified (R26.2);Other abnormalities of gait and mobility (R26.89);Muscle weakness (generalized) (M62.81)     Time: 1962-2297 PT Time Calculation (min) (ACUTE ONLY): 25 min  Charges:  $Therapeutic Exercise: 8-22 mins $Therapeutic Activity: 8-22 mins                     Guled Gahan P, PT Acute Rehabilitation Services Pager: [phone] Office: [place] Heather 12/19/2020, 10:41 AM

## 2020-12-19 NOTE — Progress Notes (Signed)
Subjective:   Overnight, no acute events.  This morning, patient states she feels tired, "all I want to do is sleep and sleep and sleep." States that "I don't want to face reality" that she will not be able to ambulate further. Discussed that we expect her to make some recovery with rehab and she understands. States she has no pain unless she moves her hip. She is hoping to work with physical therapy and occupational therapy today.  She is wondering why Ativan was prescribed versus her previous benzodiazepines. She states that she was previously prescribed benzos for "going wild." She states that she has periods of acting out like a [age]-year old including an episode earlier this morning.  Objective:  Vital signs in last 24 hours: Vitals:   12/19/20 0100 12/19/20 0300 12/19/20 0400 12/19/20 0800  BP: 136/75 132/76 131/68 136/70  Pulse: 77 74 73 84  Resp: 19 19 18 20   Temp:  98.1 F (36.7 C)    TempSrc:  Oral    SpO2: 93% 97% 99% 96%  Weight:      Height:      On 1-2L oxygen via nasal cannula  Intake/Output Summary (Last 24 hours) at 12/19/2020 0946 Last data filed at 12/18/2020 1730 Gross per 24 hour  Intake 240 ml  Output 250 ml  Net -10 ml   Physical Exam Constitutional:      General: She is not in acute distress.    Comments: Pleasant, elderly woman sitting in recliner in no acute distress  Cardiovascular:     Rate and Rhythm: Normal rate and regular rhythm.     Pulses: Normal pulses.     Heart sounds: Normal heart sounds.  Pulmonary:     Effort: Pulmonary effort is normal. No respiratory distress.     Breath sounds: Normal breath sounds.  Abdominal:     General: Abdomen is flat. Bowel sounds are normal.     Palpations: Abdomen is soft.     Tenderness: There is no abdominal tenderness.  Neurological:     General: No focal deficit present.     Mental Status: She is alert and oriented to person, place, and time. Mental status is at baseline.  Psychiatric:         Mood and Affect: Mood normal.        Behavior: Behavior normal.        Thought Content: Thought content normal.        Judgment: Judgment normal.   Labs in last 24 hours: CBC Latest Ref Rng & Units 12/19/2020 12/18/2020 12/17/2020  WBC 4.0 - 10.5 K/uL 8.9 8.5 9.5  Hemoglobin 12.0 - 15.0 g/dL 10.0(L) 9.6(L) 9.5(L)  Hematocrit 36.0 - 46.0 % 29.9(L) 29.5(L) 29.1(L)  Platelets 150 - 400 K/uL 249 180 189   BMP Latest Ref Rng & Units 12/19/2020 12/18/2020 12/17/2020  Glucose 70 - 99 mg/dL 86 85 85  BUN 8 - 23 mg/dL 9 11 11   Creatinine 0.44 - 1.00 mg/dL 0.63 0.74 0.95  Sodium 135 - 145 mmol/L 133(L) 133(L) 132(L)  Potassium 3.5 - 5.1 mmol/L 3.4(L) 3.9 3.6  Chloride 98 - 111 mmol/L 102 103 98  CO2 22 - 32 mmol/L 25 25 28   Calcium 8.9 - 10.3 mg/dL 8.1(L) 7.9(L) 8.3(L)  Magnesium - 1.7  Imaging in last 24 hours: No results found.  Assessment/Plan:  Principal Problem:   Closed displaced fracture of right femoral neck (HCC) Active Problems:   Protein-calorie malnutrition, severe   Pressure injury  of skin  Krista Parker is a 87 year old woman living at [facility] currently on hospice care with past medical history significant for chronic hypoxic respiratory failure (on 3L oxygen), HFpEF, osteoporosis and HTN who presented to [facility] on 12/14/20 following a fall found to have a displaced right femoral neck fracture now status post right hip arthroplasty.  #Goals of care, ongoing Patient has the capacity to make her own medical decisions. Patient desires to rehabilitate from her operation and does not want to spend her time in bed. She desires to discharge to a skilled nursing facility as this will improve her functional status and quality of life. Patient does not have a terminal condition requiring hospice care. Previously, patient was on hospice for COPD and heart failure per her family. She does not have COPD as documented by her pulmonologist and she has mild, diastolic dysfunction heart  failure. Patient's life expectancy is certainly greater than six months barring an acute, unexpected event. -Patient would benefit from SNF placement for rehabilitation from her recent operation -Family desires to assess SNF options as this would require discontinuing [facility]  #Displaced right femoral neck fracture status post right hip arthroplasty Patient continues to recover very well from her recent operation. Her pain is well controlled on four times daily acetaminophen, however her home regimen included two fentanyl patches and hydromorphone PRN. Will continue scheduled acetaminophen for now. She has not required PRN Norco in past several days. -Continue acetaminophen 1000mg  Q6H -Discontinue hydrocodone-acetaminophen 5-325mg  Q4H PRN -Continue PT/OT, appreciate recommendations for SNF -Weight bearing as tolerated -Follow-up with Dr. Erlinda Hong in two weeks from operation (approximately April 11th, 2022)  #Anxiety, chronic Patient's home regimen included clonazepam scheduled three times daily, alprazolam every four hours as needed, haloperidol as needed and sertraline 25mg  daily. She has tolerated a modification of this regimen. She has been taking lorazepam three times daily and reports that this is not adequately managing her symptoms. We will transition back to her long-acting benzodiazepine regimen at a reduced dose. -Start clonazepam 0.25mg  three times daily (will attempt to reduce to twice daily) -Discontinue lorazepam 0.5mg  three times daily PRN  -Continue sertraline 50mg  daily  #Hypertension, chronic Patient's home regimen of metoprolol 50mg . Blood pressure slightly above goal on metoprolol 12.5mg  daily, will increase dose slightly. -Increase metoprolol ER 25mg  daily  #Chronic hypoxic respiratory failure on 3L oxygen via nasal cannula, chronic Patient saturating in high 90s on home requirement of 3L oxygen. Patient's has tolerated titration down to 1L while our team was in  the room. -Continue supplemental oxygen to maintain SpO2 >92% (currently on 1L) -Albuterol 2 puffs Q6H PRN available, however patient has not required since admission  #GERD, chronic -Pantoprazole 20mg  daily -Continue calcium carbonate 500mg  three times daily  PRN  #Code status: DNR #Diet: Heart healthy #Bowel regimen: Senna 8.6mg  daily and Miralax 17g daily #IVF: None #VTE ppx: Enoxaparin 40mg  daily  Prior to Admission Living Arrangement: [place] Anticipated Discharge Location: SNF Barriers to Discharge: Discharge planning Dispo: Anticipated discharge in approximately 1-2 day(s).  Cato Mulligan, MD 12/19/2020, 9:46 AM Pager: [phone] After 5pm on weekdays and 1pm on weekends: On Call pager [phone]

## 2020-12-19 NOTE — TOC Transition Note (Signed)
Transition of Care [facility]) - CM/SW Discharge Note   Patient Details  Name: Krista Parker MRN: [id number] Date of Birth: [birth year]  Transition of Care [facility]) CM/SW Contact:  Joanne Chars, LCSW Phone Number: 12/19/2020, 3:53 PM   Clinical Narrative:   Pt discharging to [facility].  RN call report to [phone].  Please make sure pt has covid booster prior to discharge.     Final next level of care: Skilled Nursing Facility Barriers to Discharge: Barriers Resolved   Patient Goals and CMS Choice        Discharge Placement              Patient chooses bed at: [facility] Patient to be transferred to facility by: [facility] Name of family member notified: Gene, son Patient and family notified of of transfer: 12/19/20  Discharge Plan and Services                                     Social Determinants of Health (SDOH) Interventions     Readmission Risk Interventions No flowsheet data found.

## 2020-12-20 NOTE — Progress Notes (Signed)
Patient was picked up by [facility]  In stable condition. Vital signs within normal limits. Son Gene, informed that patient is leaving the hospital tp [facility]

## 2020-12-22 ENCOUNTER — Telehealth: Payer: Self-pay

## 2020-12-22 NOTE — Telephone Encounter (Signed)
[name] at [facility] would like wound care orders for patient?  Stated that Aquacel dressing has come off right hip.  Patient had right hip surgery on 12/15/2020.  Cb# [phone].  Please advise.  Thank yo.

## 2020-12-22 NOTE — Telephone Encounter (Signed)
Aware they can just use dry dressing

## 2021-01-01 ENCOUNTER — Ambulatory Visit (INDEPENDENT_AMBULATORY_CARE_PROVIDER_SITE_OTHER): Payer: Medicare Other

## 2021-01-01 ENCOUNTER — Ambulatory Visit (INDEPENDENT_AMBULATORY_CARE_PROVIDER_SITE_OTHER): Payer: Medicare Other | Admitting: Orthopaedic Surgery

## 2021-01-01 ENCOUNTER — Encounter: Payer: Self-pay | Admitting: Orthopaedic Surgery

## 2021-01-01 ENCOUNTER — Other Ambulatory Visit: Payer: Self-pay

## 2021-01-01 DIAGNOSIS — M25551 Pain in right hip: Secondary | ICD-10-CM

## 2021-01-01 DIAGNOSIS — Z9889 Other specified postprocedural states: Secondary | ICD-10-CM

## 2021-01-01 NOTE — Progress Notes (Signed)
   Post-Op Visit Note   Patient: Krista Parker           Date of Birth: April 02, 1932           MRN: [id number] Visit Date: 01/01/2021 PCP: Leonel Ramsay, MD   Assessment & Plan:  Chief Complaint:  Chief Complaint  Patient presents with  . Right Hip - Pain   Visit Diagnoses:  1. H/O vertebroplasty   2. Pain in right hip     Plan:   Krista Parker is 2 weeks status post right partial hip replacement due to femoral neck fracture.  She is currently had a skilled nursing facility.  She has no complaints about her hip.  She has significant back pain due to compression fractures and osteoporosis.  This is slow down recovery.  Right hip shows healed incision without any evidence of infection.  She has good painless range of motion of the hip.  From a hip standpoint she is doing very well and is not limiting her recovery.  The main issue sounds like its vertebral compression fractures and she was interested in a vertebroplasty if she is a candidate.  Referral was made to neurosurgery.  From my standpoint we will see her back in 4 weeks with AP pelvis x-rays.  Follow-Up Instructions: Return in about 4 weeks (around 01/29/2021).   Orders:  Orders Placed This Encounter  Procedures  . XR HIP UNILAT W OR W/O PELVIS 2-3 VIEWS RIGHT  . Ambulatory referral to Neurosurgery   No orders of the defined types were placed in this encounter.   Imaging: XR HIP UNILAT W OR W/O PELVIS 2-3 VIEWS RIGHT  Result Date: 01/01/2021 Stable partial right hip replacement without complication.   PMFS History: Patient Active Problem List   Diagnosis Date Noted  . Pressure injury of skin 12/16/2020  . Protein-calorie malnutrition, severe 12/15/2020  . Closed displaced fracture of right femoral neck ([place]) 12/14/2020  . Acute respiratory failure with hypoxia ([place]) 12/12/2019  . Aortic insufficiency   . Mitral regurgitation   . COPD with acute exacerbation ([place]) 12/11/2019  . CAP (community acquired  pneumonia) 12/11/2019  . Essential hypertension 12/11/2019  . Hypoxia 12/11/2019  . Hypertensive urgency 12/11/2019  . Hyponatremia 12/11/2019  . Abnormal ECG 02/13/2014  . Murmur 02/13/2014  . History of abdominal pain   . Rib pain    Past Medical History:  Diagnosis Date  . Aortic insufficiency   . Asthma   . History of abdominal pain   . Hypertension   . Mitral regurgitation   . Pneumonia   . Rib pain   . Superficial basal cell carcinoma (BCC) 07/15/2016   right neck    History reviewed. No pertinent family history.  Past Surgical History:  Procedure Laterality Date  . HIP ARTHROPLASTY Right 12/15/2020   Procedure: ANTERIOR ARTHROPLASTY BIPOLAR HIP (HEMIARTHROPLASTY);  Surgeon: Leandrew Koyanagi, MD;  Location: [facility];  Service: Orthopedics;  Laterality: Right;   Social History   Occupational History  . Not on file  Tobacco Use  . Smoking status: Former Research scientist (life sciences)  . Smokeless tobacco: Never Used  Vaping Use  . Vaping Use: Never used  Substance and Sexual Activity  . Alcohol use: Yes  . Drug use: No  . Sexual activity: Not Currently

## 2021-01-30 ENCOUNTER — Ambulatory Visit (INDEPENDENT_AMBULATORY_CARE_PROVIDER_SITE_OTHER): Payer: Medicare Other

## 2021-01-30 ENCOUNTER — Encounter: Payer: Self-pay | Admitting: Orthopaedic Surgery

## 2021-01-30 ENCOUNTER — Ambulatory Visit (INDEPENDENT_AMBULATORY_CARE_PROVIDER_SITE_OTHER): Payer: Medicare Other | Admitting: Orthopaedic Surgery

## 2021-01-30 DIAGNOSIS — Z96641 Presence of right artificial hip joint: Secondary | ICD-10-CM

## 2021-01-30 NOTE — Progress Notes (Signed)
Post-Op Visit Note   Patient: Krista Parker           Date of Birth: 08-12-1932           MRN: [id number] Visit Date: 01/30/2021 PCP: Leonel Ramsay, MD   Assessment & Plan:  Chief Complaint:  Chief Complaint  Patient presents with  . Right Hip - Pain   Visit Diagnoses:  1. History of total right hip replacement     Plan: Patient is a pleasant 87 year old female who comes in today 6-1/2 weeks out right Hemi hip arthroplasty from a femoral neck fracture.  She has been that a skilled nursing facility until yesterday.  She is now in assisted living.  She was getting physical therapy at her previous facility but is unable to afford continued therapy [facility] where she is now residing.  She does note that she is able to ambulate short distances with a walker but this needs to be with assistance.  She is unable to ambulate long distances due to her oxygen demand.  She has had continued pain to the right hip which is minimally improved.  She is taking Tylenol as needed.  Examination of the right hip reveals a fully healed surgical scar with a small scab to the proximal aspect.  No evidence of infection or cellulitis.  Calf is soft nontender.  She is able to forward flex her hip.  She is neurovascular intact distally.  At this point, she will continue working on exercises much as possible on her own.  She will follow-up with [place] in 6 weeks time for repeat evaluation and AP pelvis x-rays.  Follow-Up Instructions: Return in about 6 weeks (around 03/13/2021).   Orders:  Orders Placed This Encounter  Procedures  . XR Pelvis 1-2 Views   No orders of the defined types were placed in this encounter.   Imaging: XR Pelvis 1-2 Views  Result Date: 01/30/2021 Well-seated prosthesis without complication   PMFS History: Patient Active Problem List   Diagnosis Date Noted  . Pressure injury of skin 12/16/2020  . Protein-calorie malnutrition, severe 12/15/2020  . Closed displaced  fracture of right femoral neck ([place]) 12/14/2020  . Acute respiratory failure with hypoxia ([place]) 12/12/2019  . Aortic insufficiency   . Mitral regurgitation   . COPD with acute exacerbation ([place]) 12/11/2019  . CAP (community acquired pneumonia) 12/11/2019  . Essential hypertension 12/11/2019  . Hypoxia 12/11/2019  . Hypertensive urgency 12/11/2019  . Hyponatremia 12/11/2019  . Abnormal ECG 02/13/2014  . Murmur 02/13/2014  . History of abdominal pain   . Rib pain    Past Medical History:  Diagnosis Date  . Aortic insufficiency   . Asthma   . History of abdominal pain   . Hypertension   . Mitral regurgitation   . Pneumonia   . Rib pain   . Superficial basal cell carcinoma (BCC) 07/15/2016   right neck    History reviewed. No pertinent family history.  Past Surgical History:  Procedure Laterality Date  . HIP ARTHROPLASTY Right 12/15/2020   Procedure: ANTERIOR ARTHROPLASTY BIPOLAR HIP (HEMIARTHROPLASTY);  Surgeon: Leandrew Koyanagi, MD;  Location: [place];  Service: Orthopedics;  Laterality: Right;   Social History   Occupational History  . Not on file  Tobacco Use  . Smoking status: Former Research scientist (life sciences)  . Smokeless tobacco: Never Used  Vaping Use  . Vaping Use: Never used  Substance and Sexual Activity  . Alcohol use: Yes  . Drug use: No  .  Sexual activity: Not Currently

## 2021-03-13 ENCOUNTER — Encounter: Payer: Medicare Other | Admitting: Orthopaedic Surgery

## 2023-01-14 IMAGING — RF DG HIP (WITH PELVIS) OPERATIVE*R*
1 series · 4 of 4 positions shown · non-contrast
Comparison: Preoperative radiograph yesterday.

CLINICAL DATA: Right hip arthroplasty.

EXAM:
OPERATIVE RIGHT HIP (WITH PELVIS IF PERFORMED)
TECHNIQUE: Fluoroscopic spot image(s) were submitted for interpretation
post-operatively.

[Series 1: unknown protocol · 0.20mm/px · 4 of 4 slices shown]
[im 1/4]
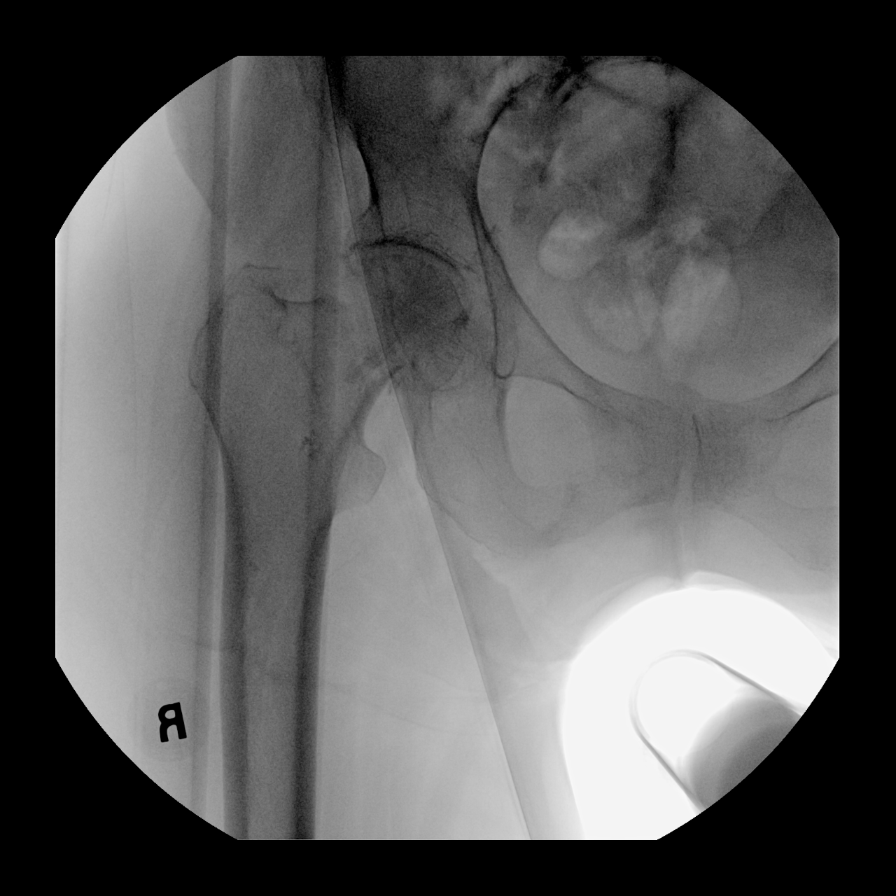
[im 2/4]
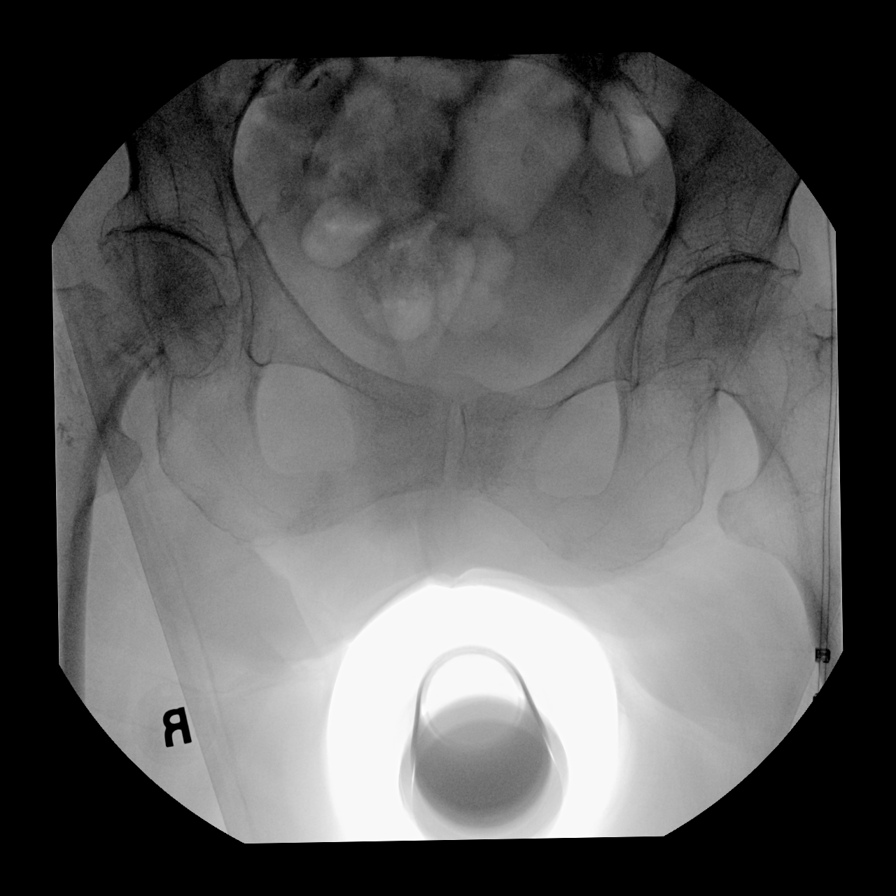
[im 3/4]
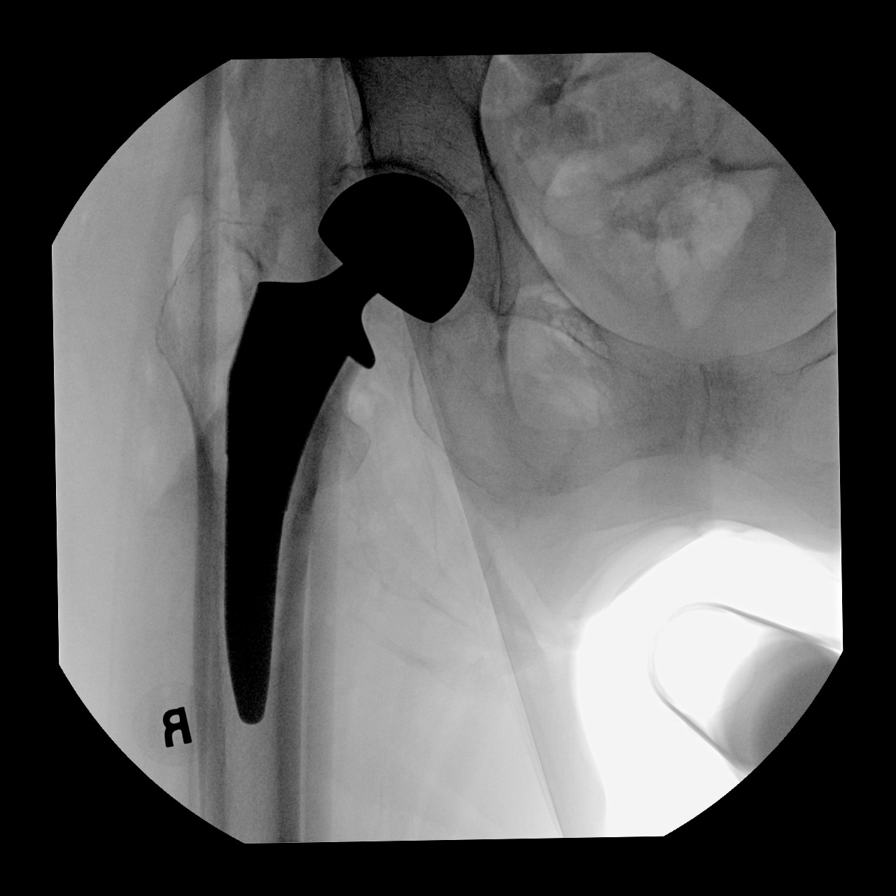
[im 4/4]
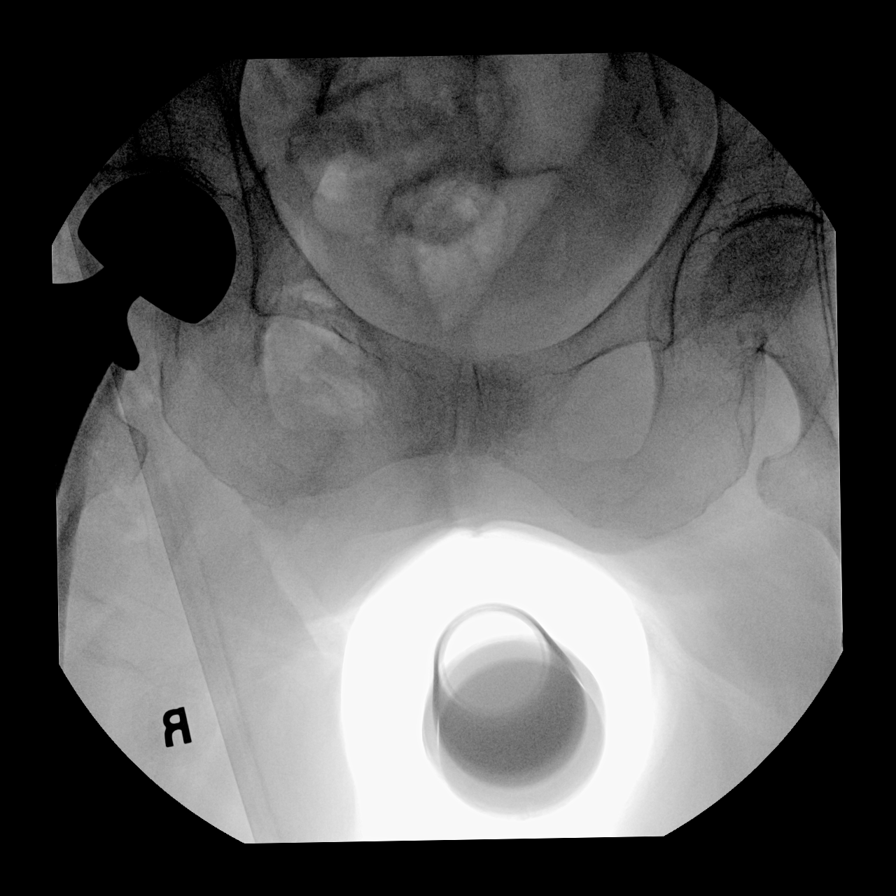

[4 of 4 positions shown; findings below may reference images not displayed]

FINDINGS: Four fluoroscopic spot views obtained in the operating room of the
pelvis and right hip. Interval right hip arthroplasty. Total
fluoroscopy time 6 seconds. Total dose 0.75 mGy.
IMPRESSION: Procedural fluoroscopy during right hip arthroplasty.
